# Patient Record
Sex: Male | Born: 1945 | Race: White | Hispanic: No | Marital: Married | Smoking: Former smoker
Health system: Southern US, Community
[De-identification: ages and names within clinical notes are randomized; demographics above are authoritative.]

## PROBLEM LIST (undated history)

## (undated) DIAGNOSIS — Z87891 Personal history of nicotine dependence: Principal | ICD-10-CM

## (undated) DIAGNOSIS — C189 Malignant neoplasm of colon, unspecified: Secondary | ICD-10-CM

## (undated) DIAGNOSIS — E785 Hyperlipidemia, unspecified: Secondary | ICD-10-CM

## (undated) HISTORY — DX: Malignant neoplasm of colon, unspecified: C18.9

## (undated) HISTORY — PX: COLON SURGERY: SHX602

## (undated) HISTORY — PX: HERNIA REPAIR: SHX51

## (undated) HISTORY — DX: Personal history of nicotine dependence: Z87.891

## (undated) HISTORY — DX: Hyperlipidemia, unspecified: E78.5

## (undated) HISTORY — PX: CATARACT EXTRACTION: SUR2

---
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
## 2005-09-11 ENCOUNTER — Ambulatory Visit: Payer: Self-pay | Admitting: Internal Medicine

## 2005-11-08 HISTORY — PX: COLECTOMY: SHX59

## 2007-08-09 HISTORY — PX: HERNIA REPAIR: SHX51

## 2010-11-08 HISTORY — PX: RETINAL DETACHMENT SURGERY: SHX105

## 2010-12-03 ENCOUNTER — Ambulatory Visit: Payer: Self-pay | Admitting: Ophthalmology

## 2012-06-05 DIAGNOSIS — H35379 Puckering of macula, unspecified eye: Secondary | ICD-10-CM | POA: Diagnosis not present

## 2012-07-17 DIAGNOSIS — E785 Hyperlipidemia, unspecified: Secondary | ICD-10-CM | POA: Diagnosis not present

## 2012-07-17 DIAGNOSIS — Z23 Encounter for immunization: Secondary | ICD-10-CM | POA: Diagnosis not present

## 2012-07-17 DIAGNOSIS — E559 Vitamin D deficiency, unspecified: Secondary | ICD-10-CM | POA: Diagnosis not present

## 2012-07-17 DIAGNOSIS — E039 Hypothyroidism, unspecified: Secondary | ICD-10-CM | POA: Diagnosis not present

## 2012-07-17 DIAGNOSIS — J449 Chronic obstructive pulmonary disease, unspecified: Secondary | ICD-10-CM | POA: Diagnosis not present

## 2012-11-13 DIAGNOSIS — H35379 Puckering of macula, unspecified eye: Secondary | ICD-10-CM | POA: Diagnosis not present

## 2013-02-06 DIAGNOSIS — E785 Hyperlipidemia, unspecified: Secondary | ICD-10-CM | POA: Diagnosis not present

## 2013-02-06 DIAGNOSIS — Z1331 Encounter for screening for depression: Secondary | ICD-10-CM | POA: Diagnosis not present

## 2013-02-06 DIAGNOSIS — Z1339 Encounter for screening examination for other mental health and behavioral disorders: Secondary | ICD-10-CM | POA: Diagnosis not present

## 2013-02-06 DIAGNOSIS — Z0000 Encounter for general adult medical examination without abnormal findings: Secondary | ICD-10-CM | POA: Diagnosis not present

## 2013-02-06 DIAGNOSIS — Z23 Encounter for immunization: Secondary | ICD-10-CM | POA: Diagnosis not present

## 2013-02-06 DIAGNOSIS — Z125 Encounter for screening for malignant neoplasm of prostate: Secondary | ICD-10-CM | POA: Diagnosis not present

## 2013-03-10 DIAGNOSIS — Z1339 Encounter for screening examination for other mental health and behavioral disorders: Secondary | ICD-10-CM | POA: Diagnosis not present

## 2013-03-10 DIAGNOSIS — T2104XA Burn of unspecified degree of lower back, initial encounter: Secondary | ICD-10-CM | POA: Diagnosis not present

## 2013-03-10 DIAGNOSIS — T2102XA Burn of unspecified degree of abdominal wall, initial encounter: Secondary | ICD-10-CM | POA: Diagnosis not present

## 2013-03-10 DIAGNOSIS — Z1331 Encounter for screening for depression: Secondary | ICD-10-CM | POA: Diagnosis not present

## 2013-06-25 DIAGNOSIS — H33009 Unspecified retinal detachment with retinal break, unspecified eye: Secondary | ICD-10-CM | POA: Diagnosis not present

## 2013-08-06 ENCOUNTER — Ambulatory Visit: Payer: Self-pay | Admitting: Family Medicine

## 2013-08-06 DIAGNOSIS — Z23 Encounter for immunization: Secondary | ICD-10-CM | POA: Diagnosis not present

## 2013-08-06 DIAGNOSIS — E785 Hyperlipidemia, unspecified: Secondary | ICD-10-CM | POA: Diagnosis not present

## 2013-08-06 DIAGNOSIS — R5381 Other malaise: Secondary | ICD-10-CM | POA: Diagnosis not present

## 2013-08-06 DIAGNOSIS — C189 Malignant neoplasm of colon, unspecified: Secondary | ICD-10-CM | POA: Diagnosis not present

## 2013-08-06 DIAGNOSIS — E039 Hypothyroidism, unspecified: Secondary | ICD-10-CM | POA: Diagnosis not present

## 2013-08-06 DIAGNOSIS — M79609 Pain in unspecified limb: Secondary | ICD-10-CM | POA: Diagnosis not present

## 2013-08-06 DIAGNOSIS — M19079 Primary osteoarthritis, unspecified ankle and foot: Secondary | ICD-10-CM | POA: Diagnosis not present

## 2014-01-14 DIAGNOSIS — H35379 Puckering of macula, unspecified eye: Secondary | ICD-10-CM | POA: Diagnosis not present

## 2014-02-18 DIAGNOSIS — Z980 Intestinal bypass and anastomosis status: Secondary | ICD-10-CM | POA: Diagnosis not present

## 2014-02-18 DIAGNOSIS — Z85038 Personal history of other malignant neoplasm of large intestine: Secondary | ICD-10-CM | POA: Diagnosis not present

## 2014-02-18 DIAGNOSIS — J449 Chronic obstructive pulmonary disease, unspecified: Secondary | ICD-10-CM | POA: Diagnosis not present

## 2014-02-18 DIAGNOSIS — E079 Disorder of thyroid, unspecified: Secondary | ICD-10-CM | POA: Diagnosis not present

## 2014-02-18 DIAGNOSIS — Z1211 Encounter for screening for malignant neoplasm of colon: Secondary | ICD-10-CM | POA: Diagnosis not present

## 2014-02-18 LAB — HM COLONOSCOPY

## 2014-03-05 DIAGNOSIS — R5381 Other malaise: Secondary | ICD-10-CM | POA: Diagnosis not present

## 2014-03-05 DIAGNOSIS — Z23 Encounter for immunization: Secondary | ICD-10-CM | POA: Diagnosis not present

## 2014-03-05 DIAGNOSIS — L03119 Cellulitis of unspecified part of limb: Secondary | ICD-10-CM | POA: Diagnosis not present

## 2014-03-05 DIAGNOSIS — E785 Hyperlipidemia, unspecified: Secondary | ICD-10-CM | POA: Diagnosis not present

## 2014-03-05 DIAGNOSIS — L02519 Cutaneous abscess of unspecified hand: Secondary | ICD-10-CM | POA: Diagnosis not present

## 2014-03-05 DIAGNOSIS — E039 Hypothyroidism, unspecified: Secondary | ICD-10-CM | POA: Diagnosis not present

## 2014-03-05 DIAGNOSIS — R5383 Other fatigue: Secondary | ICD-10-CM | POA: Diagnosis not present

## 2014-03-08 ENCOUNTER — Ambulatory Visit: Payer: Self-pay | Admitting: Family Medicine

## 2014-03-08 DIAGNOSIS — M79609 Pain in unspecified limb: Secondary | ICD-10-CM | POA: Diagnosis not present

## 2014-03-08 DIAGNOSIS — S6990XA Unspecified injury of unspecified wrist, hand and finger(s), initial encounter: Secondary | ICD-10-CM | POA: Diagnosis not present

## 2014-03-08 DIAGNOSIS — L02519 Cutaneous abscess of unspecified hand: Secondary | ICD-10-CM | POA: Diagnosis not present

## 2014-03-08 DIAGNOSIS — Z23 Encounter for immunization: Secondary | ICD-10-CM | POA: Diagnosis not present

## 2014-03-08 DIAGNOSIS — R5383 Other fatigue: Secondary | ICD-10-CM | POA: Diagnosis not present

## 2014-03-08 DIAGNOSIS — L03119 Cellulitis of unspecified part of limb: Secondary | ICD-10-CM | POA: Diagnosis not present

## 2014-03-08 DIAGNOSIS — R5381 Other malaise: Secondary | ICD-10-CM | POA: Diagnosis not present

## 2014-03-08 DIAGNOSIS — E785 Hyperlipidemia, unspecified: Secondary | ICD-10-CM | POA: Diagnosis not present

## 2014-03-08 DIAGNOSIS — E039 Hypothyroidism, unspecified: Secondary | ICD-10-CM | POA: Diagnosis not present

## 2014-03-15 DIAGNOSIS — E039 Hypothyroidism, unspecified: Secondary | ICD-10-CM | POA: Diagnosis not present

## 2014-03-15 DIAGNOSIS — Z23 Encounter for immunization: Secondary | ICD-10-CM | POA: Diagnosis not present

## 2014-03-15 DIAGNOSIS — L03119 Cellulitis of unspecified part of limb: Secondary | ICD-10-CM | POA: Diagnosis not present

## 2014-03-15 DIAGNOSIS — E785 Hyperlipidemia, unspecified: Secondary | ICD-10-CM | POA: Diagnosis not present

## 2014-03-15 DIAGNOSIS — R5381 Other malaise: Secondary | ICD-10-CM | POA: Diagnosis not present

## 2014-03-15 DIAGNOSIS — L02519 Cutaneous abscess of unspecified hand: Secondary | ICD-10-CM | POA: Diagnosis not present

## 2014-03-15 DIAGNOSIS — R5383 Other fatigue: Secondary | ICD-10-CM | POA: Diagnosis not present

## 2014-09-02 DIAGNOSIS — Z23 Encounter for immunization: Secondary | ICD-10-CM | POA: Diagnosis not present

## 2015-02-20 DIAGNOSIS — Z0000 Encounter for general adult medical examination without abnormal findings: Secondary | ICD-10-CM | POA: Diagnosis not present

## 2015-02-20 DIAGNOSIS — Z125 Encounter for screening for malignant neoplasm of prostate: Secondary | ICD-10-CM | POA: Diagnosis not present

## 2015-02-20 LAB — PSA: PSA: 1.5

## 2015-03-11 DIAGNOSIS — R5383 Other fatigue: Secondary | ICD-10-CM | POA: Diagnosis not present

## 2015-03-11 DIAGNOSIS — E559 Vitamin D deficiency, unspecified: Secondary | ICD-10-CM | POA: Diagnosis not present

## 2015-03-11 DIAGNOSIS — E780 Pure hypercholesterolemia: Secondary | ICD-10-CM | POA: Diagnosis not present

## 2015-03-11 DIAGNOSIS — E039 Hypothyroidism, unspecified: Secondary | ICD-10-CM | POA: Diagnosis not present

## 2015-03-12 DIAGNOSIS — E039 Hypothyroidism, unspecified: Secondary | ICD-10-CM | POA: Diagnosis not present

## 2015-03-12 DIAGNOSIS — E785 Hyperlipidemia, unspecified: Secondary | ICD-10-CM | POA: Diagnosis not present

## 2015-03-12 DIAGNOSIS — E559 Vitamin D deficiency, unspecified: Secondary | ICD-10-CM | POA: Diagnosis not present

## 2015-03-12 LAB — BASIC METABOLIC PANEL
BUN: 15 mg/dL (ref 4–21)
Creatinine: 1 mg/dL (ref 0.6–1.3)
Glucose: 128 mg/dL
Potassium: 4.5 mmol/L (ref 3.4–5.3)
Sodium: 140 mmol/L (ref 137–147)

## 2015-03-12 LAB — LIPID PANEL
Cholesterol: 190 mg/dL (ref 0–200)
HDL: 39 mg/dL (ref 35–70)
LDL Cholesterol: 116 mg/dL
LDL/HDL RATIO: 3
Triglycerides: 173 mg/dL — AB (ref 40–160)

## 2015-03-12 LAB — HEPATIC FUNCTION PANEL
ALK PHOS: 77 U/L (ref 25–125)
ALT: 24 U/L (ref 10–40)
AST: 19 U/L (ref 14–40)
Bilirubin, Total: 0.5 mg/dL

## 2015-03-12 LAB — TSH: TSH: 1.58 u[IU]/mL (ref 0.41–5.90)

## 2015-03-12 LAB — CBC AND DIFFERENTIAL
HCT: 44 % (ref 41–53)
Hemoglobin: 15.1 g/dL (ref 13.5–17.5)
Neutrophils Absolute: 3 /uL
PLATELETS: 273 10*3/uL (ref 150–399)
WBC: 5.4 10*3/mL

## 2015-04-19 ENCOUNTER — Other Ambulatory Visit: Payer: Self-pay | Admitting: Family Medicine

## 2015-04-24 ENCOUNTER — Telehealth: Payer: Self-pay | Admitting: Family Medicine

## 2015-04-24 NOTE — Telephone Encounter (Signed)
Patient is requesting a refill requesting for Levothyroxine Sodium 53mcg/David Trevino

## 2015-08-29 DIAGNOSIS — Z23 Encounter for immunization: Secondary | ICD-10-CM | POA: Diagnosis not present

## 2015-09-03 ENCOUNTER — Encounter: Payer: Self-pay | Admitting: Emergency Medicine

## 2015-09-03 DIAGNOSIS — H3320 Serous retinal detachment, unspecified eye: Secondary | ICD-10-CM | POA: Insufficient documentation

## 2015-09-03 DIAGNOSIS — H269 Unspecified cataract: Secondary | ICD-10-CM | POA: Insufficient documentation

## 2015-09-03 DIAGNOSIS — E739 Lactose intolerance, unspecified: Secondary | ICD-10-CM | POA: Insufficient documentation

## 2015-09-03 DIAGNOSIS — J449 Chronic obstructive pulmonary disease, unspecified: Secondary | ICD-10-CM | POA: Insufficient documentation

## 2015-09-03 DIAGNOSIS — E785 Hyperlipidemia, unspecified: Secondary | ICD-10-CM | POA: Insufficient documentation

## 2015-09-03 DIAGNOSIS — T310 Burns involving less than 10% of body surface: Secondary | ICD-10-CM | POA: Insufficient documentation

## 2015-09-03 DIAGNOSIS — E039 Hypothyroidism, unspecified: Secondary | ICD-10-CM | POA: Insufficient documentation

## 2015-09-03 DIAGNOSIS — C189 Malignant neoplasm of colon, unspecified: Secondary | ICD-10-CM | POA: Insufficient documentation

## 2015-09-03 DIAGNOSIS — E559 Vitamin D deficiency, unspecified: Secondary | ICD-10-CM | POA: Insufficient documentation

## 2015-09-11 ENCOUNTER — Ambulatory Visit (INDEPENDENT_AMBULATORY_CARE_PROVIDER_SITE_OTHER): Payer: Medicare Other | Admitting: Family Medicine

## 2015-09-11 ENCOUNTER — Encounter: Payer: Self-pay | Admitting: Family Medicine

## 2015-09-11 VITALS — BP 124/88 | HR 92 | Temp 98.6°F | Resp 14 | Wt 207.0 lb

## 2015-09-11 DIAGNOSIS — E038 Other specified hypothyroidism: Secondary | ICD-10-CM | POA: Diagnosis not present

## 2015-09-11 DIAGNOSIS — E785 Hyperlipidemia, unspecified: Secondary | ICD-10-CM | POA: Diagnosis not present

## 2015-09-11 DIAGNOSIS — M62838 Other muscle spasm: Secondary | ICD-10-CM | POA: Diagnosis not present

## 2015-09-11 DIAGNOSIS — Z23 Encounter for immunization: Secondary | ICD-10-CM | POA: Diagnosis not present

## 2015-09-11 DIAGNOSIS — R739 Hyperglycemia, unspecified: Secondary | ICD-10-CM | POA: Diagnosis not present

## 2015-09-11 DIAGNOSIS — Z87891 Personal history of nicotine dependence: Secondary | ICD-10-CM

## 2015-09-11 LAB — POCT GLYCOSYLATED HEMOGLOBIN (HGB A1C): Hemoglobin A1C: 6

## 2015-09-11 MED ORDER — CYCLOBENZAPRINE HCL 10 MG PO TABS: 10.0000 mg | ORAL_TABLET | Freq: Every day | ORAL | Status: DC

## 2015-09-11 MED ORDER — MELOXICAM 15 MG PO TABS: 15.0000 mg | ORAL_TABLET | Freq: Every day | ORAL | Status: DC
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
## 2015-09-12 LAB — CBC WITH DIFFERENTIAL/PLATELET
BASOS ABS: 0.1 10*3/uL (ref 0.0–0.2)
Basos: 1 %
EOS (ABSOLUTE): 0.1 10*3/uL (ref 0.0–0.4)
Eos: 2 %
HEMOGLOBIN: 15.2 g/dL (ref 12.6–17.7)
Hematocrit: 44.7 % (ref 37.5–51.0)
Immature Grans (Abs): 0 10*3/uL (ref 0.0–0.1)
Immature Granulocytes: 0 %
LYMPHS ABS: 1.4 10*3/uL (ref 0.7–3.1)
Lymphs: 25 %
MCH: 29.5 pg (ref 26.6–33.0)
MCHC: 34 g/dL (ref 31.5–35.7)
MCV: 87 fL (ref 79–97)
MONOCYTES: 10 %
MONOS ABS: 0.6 10*3/uL (ref 0.1–0.9)
NEUTROS ABS: 3.3 10*3/uL (ref 1.4–7.0)
Neutrophils: 62 %
Platelets: 244 10*3/uL (ref 150–379)
RBC: 5.16 x10E6/uL (ref 4.14–5.80)
RDW: 13.8 % (ref 12.3–15.4)
WBC: 5.4 10*3/uL (ref 3.4–10.8)

## 2015-09-12 LAB — LIPID PANEL WITH LDL/HDL RATIO
CHOLESTEROL TOTAL: 234 mg/dL — AB (ref 100–199)
HDL: 44 mg/dL (ref >39)
LDL Calculated: 158 mg/dL — ABNORMAL HIGH (ref 0–99)
LDl/HDL Ratio: 3.6 ratio units (ref 0.0–3.6)
TRIGLYCERIDES: 160 mg/dL — AB (ref 0–149)
VLDL Cholesterol Cal: 32 mg/dL (ref 5–40)

## 2015-09-12 LAB — COMPREHENSIVE METABOLIC PANEL
ALBUMIN: 4.5 g/dL (ref 3.6–4.8)
ALK PHOS: 72 IU/L (ref 39–117)
ALT: 24 IU/L (ref 0–44)
AST: 22 IU/L (ref 0–40)
Albumin/Globulin Ratio: 1.6 (ref 1.1–2.5)
BILIRUBIN TOTAL: 0.5 mg/dL (ref 0.0–1.2)
BUN / CREAT RATIO: 15 (ref 10–22)
BUN: 14 mg/dL (ref 8–27)
CHLORIDE: 101 mmol/L (ref 97–106)
CO2: 23 mmol/L (ref 18–29)
Calcium: 8.9 mg/dL (ref 8.6–10.2)
Creatinine, Ser: 0.95 mg/dL (ref 0.76–1.27)
GFR calc Af Amer: 95 mL/min/{1.73_m2} (ref >59)
GFR calc non Af Amer: 82 mL/min/{1.73_m2} (ref >59)
GLOBULIN, TOTAL: 2.8 g/dL (ref 1.5–4.5)
GLUCOSE: 116 mg/dL — AB (ref 65–99)
Potassium: 4.5 mmol/L (ref 3.5–5.2)
SODIUM: 140 mmol/L (ref 136–144)
Total Protein: 7.3 g/dL (ref 6.0–8.5)

## 2015-09-12 LAB — TSH: TSH: 1.11 u[IU]/mL (ref 0.450–4.500)

## 2015-09-17 ENCOUNTER — Other Ambulatory Visit: Payer: Self-pay | Admitting: Family Medicine

## 2015-09-17 ENCOUNTER — Encounter: Payer: Self-pay | Admitting: Family Medicine

## 2015-09-17 DIAGNOSIS — Z87891 Personal history of nicotine dependence: Secondary | ICD-10-CM

## 2015-09-17 HISTORY — DX: Personal history of nicotine dependence: Z87.891

## 2015-09-19 ENCOUNTER — Inpatient Hospital Stay: Payer: Medicare Other | Attending: Family Medicine | Admitting: Family Medicine

## 2015-09-19 ENCOUNTER — Ambulatory Visit
Admission: RE | Admit: 2015-09-19 | Discharge: 2015-09-19 | Disposition: A | Discharge status: Home / Self Care | Payer: Medicare Other | Source: Ambulatory Visit | Attending: Family Medicine | Admitting: Family Medicine

## 2015-09-19 ENCOUNTER — Encounter: Payer: Self-pay | Admitting: Family Medicine

## 2015-09-19 DIAGNOSIS — Z122 Encounter for screening for malignant neoplasm of respiratory organs: Secondary | ICD-10-CM

## 2015-09-19 DIAGNOSIS — Z87891 Personal history of nicotine dependence: Secondary | ICD-10-CM

## 2015-09-19 DIAGNOSIS — D3502 Benign neoplasm of left adrenal gland: Secondary | ICD-10-CM | POA: Insufficient documentation

## 2015-09-19 NOTE — Progress Notes (Signed)
In accordance with CMS guidelines, patient has meet eligibility criteria including age, absence of signs or symptoms of lung cancer, the specific calculation of cigarette smoking pack-years was 45 years and is a former smoker, having quit 9 years ago in 2007.   A shared decision-making session was conducted prior to the performance of CT scan. This includes one or more decision aids, includes benefits and harms of screening, follow-up diagnostic testing, over-diagnosis, false positive rate, and total radiation exposure.  Counseling on the importance of adherence to annual lung cancer LDCT screening, impact of co-morbidities, and ability or willingness to undergo diagnosis and treatment is imperative for compliance of the program.  Counseling on the importance of continued smoking cessation for former smokers; the importance of smoking cessation for current smokers and information about tobacco cessation interventions have been given to patient including the [place] [facility], 1800 quit , as well as [place] specific smoking cessation programs.  Written order for lung cancer screening with LDCT has been given to the patient and any and all questions have been answered to the best of my abilities.   Yearly follow up will be scheduled by Burgess Estelle, Thoracic Navigator.

## 2015-09-22 ENCOUNTER — Telehealth: Payer: Self-pay | Admitting: *Deleted

## 2015-09-22 NOTE — Telephone Encounter (Signed)
Notified patient of LDCT lung cancer screening results of Lung Rads 2 finding with recommendation for 12 month follow up imaging. Also notified of incidental finding noted below. Patient verbalizes understanding.  IMPRESSION: 1. Lung-RADS Category 2, benign appearance or behavior. Continue annual screening with low-dose chest CT without contrast in 12 months. 2. Left adrenal adenoma.

## 2016-03-10 ENCOUNTER — Ambulatory Visit (INDEPENDENT_AMBULATORY_CARE_PROVIDER_SITE_OTHER): Payer: Medicare Other | Admitting: Family Medicine

## 2016-03-10 VITALS — BP 112/84 | HR 72 | Temp 98.4°F | Resp 16 | Wt 202.0 lb

## 2016-03-10 DIAGNOSIS — R5383 Other fatigue: Secondary | ICD-10-CM | POA: Diagnosis not present

## 2016-03-10 DIAGNOSIS — L821 Other seborrheic keratosis: Secondary | ICD-10-CM | POA: Diagnosis not present

## 2016-03-10 DIAGNOSIS — L918 Other hypertrophic disorders of the skin: Secondary | ICD-10-CM

## 2016-03-10 DIAGNOSIS — E038 Other specified hypothyroidism: Secondary | ICD-10-CM

## 2016-03-10 DIAGNOSIS — K4090 Unilateral inguinal hernia, without obstruction or gangrene, not specified as recurrent: Secondary | ICD-10-CM | POA: Diagnosis not present

## 2016-03-10 DIAGNOSIS — E785 Hyperlipidemia, unspecified: Secondary | ICD-10-CM | POA: Diagnosis not present

## 2016-03-10 DIAGNOSIS — R739 Hyperglycemia, unspecified: Secondary | ICD-10-CM

## 2016-03-10 LAB — POCT GLYCOSYLATED HEMOGLOBIN (HGB A1C): Hemoglobin A1C: 6.2
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
## 2016-03-11 LAB — CBC WITH DIFFERENTIAL/PLATELET
BASOS ABS: 0 10*3/uL (ref 0.0–0.2)
Basos: 1 %
EOS (ABSOLUTE): 0.1 10*3/uL (ref 0.0–0.4)
EOS: 2 %
HEMATOCRIT: 43.9 % (ref 37.5–51.0)
HEMOGLOBIN: 15.3 g/dL (ref 12.6–17.7)
IMMATURE GRANS (ABS): 0 10*3/uL (ref 0.0–0.1)
IMMATURE GRANULOCYTES: 0 %
LYMPHS ABS: 1.9 10*3/uL (ref 0.7–3.1)
LYMPHS: 36 %
MCH: 30.3 pg (ref 26.6–33.0)
MCHC: 34.9 g/dL (ref 31.5–35.7)
MCV: 87 fL (ref 79–97)
MONOCYTES: 12 %
Monocytes Absolute: 0.7 10*3/uL (ref 0.1–0.9)
Neutrophils Absolute: 2.7 10*3/uL (ref 1.4–7.0)
Neutrophils: 49 %
Platelets: 260 10*3/uL (ref 150–379)
RBC: 5.05 x10E6/uL (ref 4.14–5.80)
RDW: 13.9 % (ref 12.3–15.4)
WBC: 5.3 10*3/uL (ref 3.4–10.8)

## 2016-03-11 LAB — COMPREHENSIVE METABOLIC PANEL
A/G RATIO: 1.6 (ref 1.2–2.2)
ALBUMIN: 4.4 g/dL (ref 3.6–4.8)
ALK PHOS: 67 IU/L (ref 39–117)
ALT: 23 IU/L (ref 0–44)
AST: 19 IU/L (ref 0–40)
BUN / CREAT RATIO: 11 (ref 10–24)
BUN: 12 mg/dL (ref 8–27)
Bilirubin Total: 0.5 mg/dL (ref 0.0–1.2)
CO2: 24 mmol/L (ref 18–29)
CREATININE: 1.08 mg/dL (ref 0.76–1.27)
Calcium: 9.1 mg/dL (ref 8.6–10.2)
Chloride: 102 mmol/L (ref 96–106)
GFR calc Af Amer: 81 mL/min/{1.73_m2} (ref >59)
GFR calc non Af Amer: 70 mL/min/{1.73_m2} (ref >59)
GLOBULIN, TOTAL: 2.8 g/dL (ref 1.5–4.5)
Glucose: 124 mg/dL — ABNORMAL HIGH (ref 65–99)
POTASSIUM: 5.1 mmol/L (ref 3.5–5.2)
SODIUM: 140 mmol/L (ref 134–144)
Total Protein: 7.2 g/dL (ref 6.0–8.5)

## 2016-03-11 LAB — LIPID PANEL WITH LDL/HDL RATIO
CHOLESTEROL TOTAL: 210 mg/dL — AB (ref 100–199)
HDL: 44 mg/dL (ref >39)
LDL Calculated: 137 mg/dL — ABNORMAL HIGH (ref 0–99)
LDl/HDL Ratio: 3.1 ratio units (ref 0.0–3.6)
Triglycerides: 146 mg/dL (ref 0–149)
VLDL CHOLESTEROL CAL: 29 mg/dL (ref 5–40)

## 2016-03-11 LAB — TSH: TSH: 1.39 u[IU]/mL (ref 0.450–4.500)

## 2016-03-23 ENCOUNTER — Encounter: Payer: Self-pay | Admitting: Family Medicine

## 2016-03-23 ENCOUNTER — Ambulatory Visit (INDEPENDENT_AMBULATORY_CARE_PROVIDER_SITE_OTHER): Payer: Medicare Other | Admitting: Family Medicine

## 2016-03-23 VITALS — BP 120/82 | HR 84 | Temp 98.0°F | Resp 16 | Wt 201.8 lb

## 2016-03-23 DIAGNOSIS — J029 Acute pharyngitis, unspecified: Secondary | ICD-10-CM

## 2016-03-23 DIAGNOSIS — J0100 Acute maxillary sinusitis, unspecified: Secondary | ICD-10-CM | POA: Diagnosis not present

## 2016-03-23 LAB — POCT RAPID STREP A (OFFICE): Rapid Strep A Screen: NEGATIVE

## 2016-03-23 MED ORDER — PSEUDOEPH-BROMPHEN-DM 30-2-10 MG/5ML PO SYRP: 5.0000 mL | ORAL_SOLUTION | Freq: Four times a day (QID) | ORAL | Status: DC | PRN

## 2016-03-23 MED ORDER — AMOXICILLIN 500 MG PO CAPS: 500.0000 mg | ORAL_CAPSULE | Freq: Three times a day (TID) | ORAL | Status: DC
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
## 2016-04-20 ENCOUNTER — Ambulatory Visit (INDEPENDENT_AMBULATORY_CARE_PROVIDER_SITE_OTHER): Payer: Medicare Other | Admitting: Family Medicine

## 2016-04-20 ENCOUNTER — Encounter: Payer: Self-pay | Admitting: Family Medicine

## 2016-04-20 VITALS — BP 120/78 | HR 78 | Temp 98.2°F | Resp 16 | Wt 202.4 lb

## 2016-04-20 DIAGNOSIS — R05 Cough: Secondary | ICD-10-CM

## 2016-04-20 DIAGNOSIS — R0982 Postnasal drip: Secondary | ICD-10-CM

## 2016-04-20 DIAGNOSIS — R059 Cough, unspecified: Secondary | ICD-10-CM

## 2016-04-20 MED ORDER — IPRATROPIUM BROMIDE 0.03 % NA SOLN: 2.0000 | Freq: Two times a day (BID) | NASAL | Status: DC

## 2016-04-20 MED ORDER — BENZONATATE 200 MG PO CAPS: 200.0000 mg | ORAL_CAPSULE | Freq: Two times a day (BID) | ORAL | Status: DC | PRN

## 2016-04-20 MED ORDER — LORATADINE 10 MG PO TABS: 10.0000 mg | ORAL_TABLET | Freq: Every day | ORAL | Status: DC
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
## 2016-04-20 NOTE — Patient Instructions (Signed)

## 2016-04-24 ENCOUNTER — Other Ambulatory Visit: Payer: Self-pay | Admitting: Family Medicine

## 2016-05-19 ENCOUNTER — Telehealth: Payer: Self-pay | Admitting: Emergency Medicine

## 2016-05-19 MED ORDER — LEVOTHYROXINE SODIUM 50 MCG PO TABS: 50.0000 ug | ORAL_TABLET | Freq: Every day | ORAL | Status: DC

## 2016-05-19 NOTE — Telephone Encounter (Signed)
Pt medication called in.

## 2016-08-17 DIAGNOSIS — Z23 Encounter for immunization: Secondary | ICD-10-CM | POA: Diagnosis not present

## 2016-09-21 ENCOUNTER — Ambulatory Visit (INDEPENDENT_AMBULATORY_CARE_PROVIDER_SITE_OTHER): Payer: Medicare Other | Admitting: Family Medicine

## 2016-09-21 ENCOUNTER — Encounter: Payer: Self-pay | Admitting: Family Medicine

## 2016-09-21 VITALS — BP 118/82 | HR 86 | Temp 98.3°F | Resp 14 | Ht 73.0 in | Wt 204.0 lb

## 2016-09-21 DIAGNOSIS — M25571 Pain in right ankle and joints of right foot: Secondary | ICD-10-CM | POA: Diagnosis not present

## 2016-09-21 DIAGNOSIS — R739 Hyperglycemia, unspecified: Secondary | ICD-10-CM | POA: Diagnosis not present

## 2016-09-21 DIAGNOSIS — Z1211 Encounter for screening for malignant neoplasm of colon: Secondary | ICD-10-CM

## 2016-09-21 DIAGNOSIS — Z0000 Encounter for general adult medical examination without abnormal findings: Secondary | ICD-10-CM | POA: Diagnosis not present

## 2016-09-21 LAB — POCT GLYCOSYLATED HEMOGLOBIN (HGB A1C): HEMOGLOBIN A1C: 5.6
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
## 2017-03-10 LAB — IFOBT (OCCULT BLOOD): IMMUNOLOGICAL FECAL OCCULT BLOOD TEST: NEGATIVE

## 2017-03-22 ENCOUNTER — Ambulatory Visit (INDEPENDENT_AMBULATORY_CARE_PROVIDER_SITE_OTHER): Payer: Medicare Other | Admitting: Family Medicine

## 2017-03-22 ENCOUNTER — Encounter: Payer: Self-pay | Admitting: Family Medicine

## 2017-03-22 VITALS — BP 104/72 | HR 66 | Temp 98.4°F | Resp 14 | Wt 203.0 lb

## 2017-03-22 DIAGNOSIS — E7849 Other hyperlipidemia: Secondary | ICD-10-CM

## 2017-03-22 DIAGNOSIS — R5383 Other fatigue: Secondary | ICD-10-CM | POA: Diagnosis not present

## 2017-03-22 DIAGNOSIS — E784 Other hyperlipidemia: Secondary | ICD-10-CM

## 2017-03-22 DIAGNOSIS — Z1159 Encounter for screening for other viral diseases: Secondary | ICD-10-CM

## 2017-03-22 DIAGNOSIS — R739 Hyperglycemia, unspecified: Secondary | ICD-10-CM | POA: Diagnosis not present

## 2017-03-22 DIAGNOSIS — E038 Other specified hypothyroidism: Secondary | ICD-10-CM | POA: Diagnosis not present
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
## 2017-03-23 LAB — COMPREHENSIVE METABOLIC PANEL
A/G RATIO: 1.7 (ref 1.2–2.2)
ALBUMIN: 4.5 g/dL (ref 3.6–4.8)
ALT: 20 IU/L (ref 0–44)
AST: 21 IU/L (ref 0–40)
Alkaline Phosphatase: 71 IU/L (ref 39–117)
BUN / CREAT RATIO: 11 (ref 10–24)
BUN: 12 mg/dL (ref 8–27)
Bilirubin Total: 0.5 mg/dL (ref 0.0–1.2)
CALCIUM: 9.2 mg/dL (ref 8.6–10.2)
CO2: 20 mmol/L (ref 18–29)
CREATININE: 1.08 mg/dL (ref 0.76–1.27)
Chloride: 102 mmol/L (ref 96–106)
GFR, EST AFRICAN AMERICAN: 81 mL/min/{1.73_m2} (ref >59)
GFR, EST NON AFRICAN AMERICAN: 70 mL/min/{1.73_m2} (ref >59)
GLOBULIN, TOTAL: 2.6 g/dL (ref 1.5–4.5)
Glucose: 122 mg/dL — ABNORMAL HIGH (ref 65–99)
POTASSIUM: 4.3 mmol/L (ref 3.5–5.2)
SODIUM: 139 mmol/L (ref 134–144)
Total Protein: 7.1 g/dL (ref 6.0–8.5)

## 2017-03-23 LAB — CBC WITH DIFFERENTIAL/PLATELET
BASOS: 1 %
Basophils Absolute: 0 10*3/uL (ref 0.0–0.2)
EOS (ABSOLUTE): 0.1 10*3/uL (ref 0.0–0.4)
EOS: 2 %
HEMATOCRIT: 46.3 % (ref 37.5–51.0)
HEMOGLOBIN: 15.3 g/dL (ref 13.0–17.7)
IMMATURE GRANULOCYTES: 0 %
Immature Grans (Abs): 0 10*3/uL (ref 0.0–0.1)
LYMPHS ABS: 1.7 10*3/uL (ref 0.7–3.1)
Lymphs: 39 %
MCH: 29.9 pg (ref 26.6–33.0)
MCHC: 33 g/dL (ref 31.5–35.7)
MCV: 91 fL (ref 79–97)
MONOCYTES: 10 %
MONOS ABS: 0.4 10*3/uL (ref 0.1–0.9)
Neutrophils Absolute: 2.2 10*3/uL (ref 1.4–7.0)
Neutrophils: 48 %
Platelets: 230 10*3/uL (ref 150–379)
RBC: 5.11 x10E6/uL (ref 4.14–5.80)
RDW: 14.9 % (ref 12.3–15.4)
WBC: 4.4 10*3/uL (ref 3.4–10.8)

## 2017-03-23 LAB — LIPID PANEL WITH LDL/HDL RATIO
Cholesterol, Total: 191 mg/dL (ref 100–199)
HDL: 43 mg/dL (ref >39)
LDL CALC: 115 mg/dL — AB (ref 0–99)
LDL/HDL RATIO: 2.7 ratio (ref 0.0–3.6)
Triglycerides: 165 mg/dL — ABNORMAL HIGH (ref 0–149)
VLDL Cholesterol Cal: 33 mg/dL (ref 5–40)

## 2017-03-23 LAB — HEPATITIS C ANTIBODY

## 2017-03-23 LAB — HEMOGLOBIN A1C
ESTIMATED AVERAGE GLUCOSE: 131 mg/dL
HEMOGLOBIN A1C: 6.2 % — AB (ref 4.8–5.6)

## 2017-03-23 LAB — TSH: TSH: 1.65 u[IU]/mL (ref 0.450–4.500)

## 2017-03-24 ENCOUNTER — Telehealth: Payer: Self-pay

## 2017-03-24 NOTE — Telephone Encounter (Signed)
-----   Message from [name], MD sent at 03/24/2017  2:24 PM EDT ----- Labs stable

## 2017-03-24 NOTE — Telephone Encounter (Signed)
Advised pt of lab results. Pt verbally acknowledges understanding. Emily Drozdowski, CMA   

## 2017-04-11 ENCOUNTER — Other Ambulatory Visit: Payer: Self-pay | Admitting: Family Medicine

## 2017-07-16 ENCOUNTER — Emergency Department
Admission: EM | Admit: 2017-07-16 | Discharge: 2017-07-16 | Disposition: A | Discharge status: Home / Self Care | Payer: Medicare Other | Attending: Emergency Medicine | Admitting: Emergency Medicine

## 2017-07-16 ENCOUNTER — Encounter: Payer: Self-pay | Admitting: Emergency Medicine

## 2017-07-16 ENCOUNTER — Emergency Department: Payer: Medicare Other

## 2017-07-16 DIAGNOSIS — Z79899 Other long term (current) drug therapy: Secondary | ICD-10-CM | POA: Insufficient documentation

## 2017-07-16 DIAGNOSIS — Y999 Unspecified external cause status: Secondary | ICD-10-CM | POA: Insufficient documentation

## 2017-07-16 DIAGNOSIS — Y939 Activity, unspecified: Secondary | ICD-10-CM | POA: Insufficient documentation

## 2017-07-16 DIAGNOSIS — W010XXA Fall on same level from slipping, tripping and stumbling without subsequent striking against object, initial encounter: Secondary | ICD-10-CM | POA: Insufficient documentation

## 2017-07-16 DIAGNOSIS — E039 Hypothyroidism, unspecified: Secondary | ICD-10-CM | POA: Insufficient documentation

## 2017-07-16 DIAGNOSIS — Y9289 Other specified places as the place of occurrence of the external cause: Secondary | ICD-10-CM | POA: Diagnosis not present

## 2017-07-16 DIAGNOSIS — W231XXA Caught, crushed, jammed, or pinched between stationary objects, initial encounter: Secondary | ICD-10-CM | POA: Insufficient documentation

## 2017-07-16 DIAGNOSIS — S59901A Unspecified injury of right elbow, initial encounter: Secondary | ICD-10-CM | POA: Insufficient documentation

## 2017-07-16 DIAGNOSIS — M25421 Effusion, right elbow: Secondary | ICD-10-CM | POA: Diagnosis not present

## 2017-07-16 DIAGNOSIS — Z85038 Personal history of other malignant neoplasm of large intestine: Secondary | ICD-10-CM | POA: Insufficient documentation

## 2017-07-16 MED ORDER — HYDROCODONE-ACETAMINOPHEN 5-325 MG PO TABS: 1.0000 | ORAL_TABLET | Freq: Four times a day (QID) | ORAL | 0 refills | Status: DC | PRN

## 2017-07-16 NOTE — ED Triage Notes (Signed)
Patient presents to the ED with pain to right elbow with movement of arm.  Patient states he had a mechanical fall at approx. 3pm.  Patient reports stepping in briars, and then quickly jumping back and losing his balance.  Patient reports he landed on his elbow and states, "It looked like it was out of joint, but I kind of pulled it and it seemed like it went back, but now it's really swollen."  Patient is in no obvious distress at this time.
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
## 2017-07-16 NOTE — ED Notes (Signed)
Patient transported to X-ray 

## 2017-07-18 DIAGNOSIS — S5001XA Contusion of right elbow, initial encounter: Secondary | ICD-10-CM | POA: Diagnosis not present

## 2017-07-19 DIAGNOSIS — S5000XA Contusion of unspecified elbow, initial encounter: Secondary | ICD-10-CM | POA: Insufficient documentation

## 2017-07-28 DIAGNOSIS — S5001XA Contusion of right elbow, initial encounter: Secondary | ICD-10-CM | POA: Diagnosis not present

## 2017-08-10 ENCOUNTER — Telehealth: Payer: Self-pay | Admitting: Family Medicine

## 2017-09-22 ENCOUNTER — Telehealth: Payer: Self-pay

## 2017-09-22 ENCOUNTER — Ambulatory Visit: Payer: Medicare Other | Admitting: Family Medicine

## 2017-09-22 NOTE — Telephone Encounter (Signed)
Tried to schedule AWV for pt before next apt on 10/03/17. No available times with [facility] before the 4:00 PCP apt. Pt declined doing this on an earlier date. Pt states it is the end of the year and he cannot take a lot of time off work. Advised pt to CB at a better time when he has availability to schedule this apt. Pt stated understanding.

## 2017-10-03 ENCOUNTER — Ambulatory Visit (INDEPENDENT_AMBULATORY_CARE_PROVIDER_SITE_OTHER): Payer: Medicare Other | Admitting: Family Medicine

## 2017-10-03 ENCOUNTER — Encounter: Payer: Self-pay | Admitting: Family Medicine

## 2017-10-03 VITALS — BP 132/80 | HR 84 | Temp 98.4°F | Resp 18 | Wt 209.0 lb

## 2017-10-03 DIAGNOSIS — E039 Hypothyroidism, unspecified: Secondary | ICD-10-CM

## 2017-10-03 DIAGNOSIS — E785 Hyperlipidemia, unspecified: Secondary | ICD-10-CM

## 2017-10-03 DIAGNOSIS — C189 Malignant neoplasm of colon, unspecified: Secondary | ICD-10-CM | POA: Diagnosis not present

## 2017-10-03 DIAGNOSIS — R739 Hyperglycemia, unspecified: Secondary | ICD-10-CM | POA: Diagnosis not present

## 2017-10-03 LAB — POCT GLYCOSYLATED HEMOGLOBIN (HGB A1C): Hemoglobin A1C: 6

## 2017-10-03 NOTE — Progress Notes (Signed)
Patient: David Trevino Male    DOB: 12/31/1945   71 y.o.   MRN: [id number] Visit Date: 10/03/2017  Today's Provider: Wilhemena Durie, MD   Chief Complaint  Patient presents with  . Hyperglycemia  . Hypothyroidism   Subjective:    HPI Pt is here today for a routine 6 month follow up. He reports that he has been feeling well until this past week he has had a head cold, but feels like he is getting better.   Hyperglycemia Last a1c was 6.2 on 03/22/17.      No Known Allergies   Current Outpatient Medications:  .  Cholecalciferol (VITAMIN D) 2000 UNITS tablet, Take by mouth., Disp: , Rfl:  .  ipratropium (ATROVENT) 0.03 % nasal spray, Place 2 sprays into both nostrils every 12 (twelve) hours., Disp: 30 mL, Rfl: 12 .  levothyroxine (SYNTHROID, LEVOTHROID) 50 MCG tablet, TAKE 1 TABLET BY MOUTH ONCE A DAY, Disp: 90 tablet, Rfl: 3 .  loratadine (CLARITIN) 10 MG tablet, Take 1 tablet (10 mg total) by mouth daily., Disp: 30 tablet, Rfl: 11 .  Niacin CR 250 MG TBCR, Take by mouth., Disp: , Rfl:  .  omega-3 fish oil (MAXEPA) 1000 MG CAPS capsule, Take by mouth., Disp: , Rfl:  .  HYDROcodone-acetaminophen (NORCO/VICODIN) 5-325 MG tablet, Take 1 tablet by mouth every 6 (six) hours as needed for moderate pain. (Patient not taking: Reported on 10/03/2017), Disp: 6 tablet, Rfl: 0  Review of Systems  Constitutional: Positive for fatigue.  HENT: Positive for rhinorrhea, sinus pressure and sneezing.   Eyes: Negative.   Respiratory: Negative.   Cardiovascular: Negative.   Gastrointestinal: Negative.   Endocrine: Negative.   Genitourinary: Negative.   Musculoskeletal: Negative.   Skin: Negative.   Allergic/Immunologic: Negative.   Neurological: Negative.   Hematological: Negative.   Psychiatric/Behavioral: Negative.     Social History   Tobacco Use  . Smoking status: Former Smoker    Packs/day: 1.00    Years: 40.00    Pack years: 40.00    Types: Cigarettes   Last attempt to quit: 11/21/2005    Years since quitting: 11.8  . Smokeless tobacco: Never Used  Substance Use Topics  . Alcohol use: No    Comment: Quit drinking alcohol 10/16/2005   Objective:   BP 132/80 (BP Location: Right Arm, Patient Position: Sitting, Cuff Size: Normal)   Pulse 84   Temp 98.4 F (36.9 C) (Oral)   Resp 18   Wt 209 lb (94.8 kg)   BMI 27.57 kg/m  Vitals:   10/03/17 1554  BP: 132/80  Pulse: 84  Resp: 18  Temp: 98.4 F (36.9 C)  TempSrc: Oral  Weight: 209 lb (94.8 kg)     Physical Exam  Constitutional: He is oriented to person, place, and time. He appears well-developed and well-nourished.  HENT:  Head: Normocephalic and atraumatic.  Eyes: Conjunctivae are normal. No scleral icterus.  Neck: No thyromegaly present.  Cardiovascular: Normal rate, regular rhythm and normal heart sounds.  Pulmonary/Chest: Effort normal and breath sounds normal.  Abdominal: Soft.  Neurological: He is alert and oriented to person, place, and time.  Skin: Skin is warm and dry.  Psychiatric: He has a normal mood and affect. His behavior is normal. Judgment and thought content normal.        Assessment & Plan:     1. Adult hypothyroidism   2. Hyperlipidemia, unspecified hyperlipidemia type   3. Hyperglycemia  -  POCT HgB A1C      I have done the exam and reviewed the above chart and it is accurate to the best of my knowledge. Development worker, community has been used in this note in any air is in the dictation or transcription are unintentional.  Wilhemena Durie, MD  [place]

## 2017-12-15 NOTE — Telephone Encounter (Signed)
Visit scheduled °

## 2018-03-28 ENCOUNTER — Ambulatory Visit (INDEPENDENT_AMBULATORY_CARE_PROVIDER_SITE_OTHER): Payer: Medicare Other

## 2018-03-28 VITALS — BP 150/78 | HR 62 | Temp 97.8°F | Ht 73.0 in | Wt 207.4 lb

## 2018-03-28 DIAGNOSIS — Z0000 Encounter for general adult medical examination without abnormal findings: Secondary | ICD-10-CM | POA: Diagnosis not present

## 2018-03-28 NOTE — Progress Notes (Signed)
Subjective:   David Trevino is a 72 y.o. male who presents for Medicare Annual/Subsequent preventive examination.  Review of Systems:  N/A  Cardiac Risk Factors include: advanced age (>40men, >71 women);dyslipidemia;male gender     Objective:    Vitals: BP (!) 150/78 (BP Location: Right Arm)   Pulse 62   Temp 97.8 F (36.6 C) (Oral)   Ht 6\' 1"  (1.854 m)   Wt 207 lb 6.4 oz (94.1 kg)   BMI 27.36 kg/m   Body mass index is 27.36 kg/m.  Advanced Directives 03/28/2018 07/16/2017 09/21/2016  Does Patient Have a Medical Advance Directive? No No No  Would patient like information on creating a medical advance directive? Yes (MAU/Ambulatory/Procedural Areas - Information given) No - Patient declined -    Tobacco Social History   Tobacco Use  Smoking Status Former Smoker  . Packs/day: 1.00  . Years: 40.00  . Pack years: 40.00  . Types: Cigarettes  . Last attempt to quit: 11/21/2005  . Years since quitting: 12.3  Smokeless Tobacco Never Used     Counseling given: Not Answered   Clinical Intake:  Pre-visit preparation completed: Yes  Pain : No/denies pain Pain Score: 0-No pain     Nutritional Status: BMI 25 -29 Overweight Nutritional Risks: None Diabetes: No  How often do you need to have someone help you when you read instructions, pamphlets, or other written materials from your doctor or pharmacy?: 1 - Never  Interpreter Needed?: No  Information entered by :: [facility], LPN  Past Medical History:  Diagnosis Date  . Hyperlipidemia   . Personal history of tobacco use, presenting hazards to health 09/17/2015   Past Surgical History:  Procedure Laterality Date  . CATARACT EXTRACTION Bilateral    was done in 2 different months but unsure of which months.   . 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 HERNIA REPAIR  08/2007  . RETINAL DETACHMENT SURGERY Right 11/2010  .  TONSILLECTOMY AND ADENOIDECTOMY  1960   Family History  Problem Relation Age of Onset  . Arthritis Mother   . Diabetes Mother   . Cancer Father        bladder  . Kidney disease Father   . Allergies Sister   . Cancer Brother        prostate   Social History   Socioeconomic History  . Marital status: Married    Spouse name: Not on file  . Number of children: 2  . Years of education: Not on file  . Highest education level: Some college, no degree  Occupational History  . Occupation: retired  Scientific laboratory technician  . Financial resource strain: Not hard at all  . Food insecurity:    Worry: Never true    Inability: Never true  . Transportation needs:    Medical: No    Non-medical: No  Tobacco Use  . Smoking status: Former Smoker    Packs/day: 1.00    Years: 40.00    Pack years: 40.00    Types: Cigarettes    Last attempt to quit: 11/21/2005    Years since quitting: 12.3  . Smokeless tobacco: Never Used  Substance and Sexual Activity  . Alcohol use: No    Comment: Quit drinking alcohol 10/16/2005  . Drug use: No  . Sexual activity: Not on file  Lifestyle  . Physical activity:    Days per week: Not  on file    Minutes per session: Not on file  . Stress: Not at all  Relationships  . Social connections:    Talks on phone: Not on file    Gets together: Not on file    Attends religious service: Not on file    Active member of club or organization: Not on file    Attends meetings of clubs or organizations: Not on file    Relationship status: Not on file  Other Topics Concern  . Not on file  Social History Narrative  . Not on file    Outpatient Encounter Medications as of 03/28/2018  Medication Sig  . aspirin-acetaminophen-caffeine (EXCEDRIN MIGRAINE) 250-250-65 MG tablet Take 1 tablet by mouth every 6 (six) hours as needed for headache.  . Cholecalciferol (VITAMIN D) 2000 UNITS tablet Take 2,000 Units by mouth daily.   Marland Kitchen ipratropium (ATROVENT) 0.03 % nasal spray Place 2  sprays into both nostrils every 12 (twelve) hours. (Patient taking differently: Place 2 sprays into both nostrils 2 (two) times daily. )  . levothyroxine (SYNTHROID, LEVOTHROID) 50 MCG tablet TAKE 1 TABLET BY MOUTH ONCE A DAY  . loratadine (CLARITIN) 10 MG tablet Take 1 tablet (10 mg total) by mouth daily.  . Niacin CR 250 MG TBCR Take 250 mg by mouth daily.   Marland Kitchen omega-3 fish oil (MAXEPA) 1000 MG CAPS capsule Take 1 capsule by mouth daily.   Marland Kitchen HYDROcodone-acetaminophen (NORCO/VICODIN) 5-325 MG tablet Take 1 tablet by mouth every 6 (six) hours as needed for moderate pain. (Patient not taking: Reported on 10/03/2017)  . niacin 250 MG tablet Take by mouth.  . Omega-3 1000 MG CAPS Take by mouth.   No facility-administered encounter medications on file as of 03/28/2018.     Activities of Daily Living In your present state of health, do you have any difficulty performing the following activities: 03/28/2018 10/03/2017  Hearing? N N  Vision? N N  Difficulty concentrating or making decisions? N N  Walking or climbing stairs? N N  Dressing or bathing? N N  Doing errands, shopping? N N  Preparing Food and eating ? N -  Using the Toilet? N -  In the past six months, have you accidently leaked urine? N -  Do you have problems with loss of bowel control? N -  Managing your Medications? N -  Managing your Finances? N -  Housekeeping or managing your Housekeeping? N -  Some recent data might be hidden    Patient Care Team: [name], MD as PCP - General (Family Medicine) Manya Silvas, MD as Consulting Physician (Gastroenterology) Aurelio Jew, MD as Referring Physician (Surgical Oncology)   Assessment:   This is a routine wellness examination for [facility].  Exercise Activities and Dietary recommendations Current Exercise Habits: The patient does not participate in regular exercise at present(Works around home and yardwork.), Exercise limited by: None identified  Goals     . DIET - REDUCE SUGAR INTAKE     Recommend cutting back on sugar and sweets to a couple times a week.        Fall Risk Fall Risk  03/28/2018 10/03/2017 09/21/2016 09/11/2015  Falls in the past year? Yes Yes Yes No  Number falls in past yr: 1 1 1  -  Comment stumble to a fall - - -  Injury with Fall? No Yes No -  Follow up Falls prevention discussed - - -   Is the patient's home free of loose  throw rugs in walkways, pet beds, electrical cords, etc?   yes      Grab bars in the bathroom? no      Handrails on the stairs?   no      Adequate lighting?   yes  Timed Get Up and Go Performed: N/A  Depression Screen PHQ 2/9 Scores 03/28/2018 10/03/2017 09/21/2016 09/11/2015  PHQ - 2 Score 0 0 0 0    Cognitive Function     6CIT Screen 03/28/2018 09/21/2016  What Year? 0 points 0 points  What month? 0 points 0 points  What time? 0 points 0 points  Count back from 20 0 points 0 points  Months in reverse 0 points 0 points  Repeat phrase 0 points 0 points  Total Score 0 0    Immunization History  Administered Date(s) Administered  . Influenza,inj,Quad PF,6+ Mos 08/06/2013  . Pneumococcal Conjugate-13 09/11/2015  . Pneumococcal Polysaccharide-23 07/17/2012  . Tdap 05/02/2007    Qualifies for Shingles Vaccine? Due for Shingles vaccine. Declined my offer to administer today. Education has been provided regarding the importance of this vaccine. Pt has been advised to call her insurance company to determine her out of pocket expense. Advised she may also receive this vaccine at her local pharmacy or Health Dept. Verbalized acceptance and understanding.   Screening Tests Health Maintenance  Topic Date Due  . TETANUS/TDAP  05/01/2017  . INFLUENZA VACCINE  06/08/2018  . COLONOSCOPY  02/19/2024  . Hepatitis C Screening  Completed  . PNA vac Low Risk Adult  Completed   Cancer Screenings: Lung: Low Dose CT Chest recommended if Age 72-80 years, 30 pack-year currently smoking OR have quit  w/in 15years. Patient does qualify. An Epic message has been sent to Burgess Estelle, RN (Oncology Nurse Navigator) regarding the possible need for this exam. Raquel Sarna will review the patient's chart to determine if the patient truly qualifies for the exam. If the patient qualifies, Raquel Sarna will order the Low Dose CT of the chest to facilitate the scheduling of this exam. Colorectal: Up to date  Additional Screenings:  Hepatitis C Screening:Up to date      Plan:  I have personally reviewed and addressed the Medicare Annual Wellness questionnaire and have noted the following in the patient's chart:  A. Medical and social history B. Use of alcohol, tobacco or illicit drugs  C. Current medications and supplements D. Functional ability and status E.  Nutritional status F.  Physical activity G. Advance directives H. List of other physicians I.  Hospitalizations, surgeries, and ER visits in previous 12 months J.  [place] such as hearing and vision if needed, cognitive and depression L. Referrals and appointments - none  In addition, I have reviewed and discussed with patient certain preventive protocols, quality metrics, and best practice recommendations. A written personalized care plan for preventive services as well as general preventive health recommendations were provided to patient.  See attached scanned questionnaire for additional information.   Signed,  Fabio Neighbors, LPN Nurse Health Advisor   Nurse Recommendations: Chest CT scan referral sent to nurse navigator to set up. Pt declined the tetanus vaccine today.

## 2018-03-28 NOTE — Patient Instructions (Addendum)
David Trevino , Thank you for taking time to come for your Medicare Wellness Visit. I appreciate your ongoing commitment to your health goals. Please review the following plan we discussed and let me know if I can assist you in the future.   Screening recommendations/referrals: Colonoscopy: Up to date Recommended yearly ophthalmology/optometry visit for glaucoma screening and checkup Recommended yearly dental visit for hygiene and checkup  Vaccinations: Influenza vaccine: N/A Pneumococcal vaccine: Up to date Tdap vaccine: Pt declines today.  Shingles vaccine: Pt declines today.     Advanced directives: Advance directive discussed with you today. I have provided a copy for you to complete at home and have notarized. Once this is complete please bring a copy in to our office so we can scan it into your chart.  Conditions/risks identified: Fall risk prevention; Recommend cutting back on sugar and sweets to a couple times a week.   Next appointment: 9:40 AM today with Dr Rosanna Randy.   Preventive Care 71 Years and Older, Male Preventive care refers to lifestyle choices and visits with your health care provider that can promote health and wellness. What does preventive care include?  A yearly physical exam. This is also called an annual well check.  Dental exams once or twice a year.  Routine eye exams. Ask your health care provider how often you should have your eyes checked.  Personal lifestyle choices, including:  Daily care of your teeth and gums.  Regular physical activity.  Eating a healthy diet.  Avoiding tobacco and drug use.  Limiting alcohol use.  Practicing safe sex.  Taking low doses of aspirin every day.  Taking vitamin and mineral supplements as recommended by your health care provider. What happens during an annual well check? The services and screenings done by your health care provider during your annual well check will depend on your age, overall health,  lifestyle risk factors, and family history of disease. Counseling  Your health care provider may ask you questions about your:  Alcohol use.  Tobacco use.  Drug use.  Emotional well-being.  Home and relationship well-being.  Sexual activity.  Eating habits.  History of falls.  Memory and ability to understand (cognition).  Work and work Statistician. Screening  You may have the following tests or measurements:  Height, weight, and BMI.  Blood pressure.  Lipid and cholesterol levels. These may be checked every 5 years, or more frequently if you are over 37 years old.  Skin check.  Lung cancer screening. You may have this screening every year starting at age 36 if you have a 30-pack-year history of smoking and currently smoke or have quit within the past 15 years.  Fecal occult blood test (FOBT) of the stool. You may have this test every year starting at age 49.  Flexible sigmoidoscopy or colonoscopy. You may have a sigmoidoscopy every 5 years or a colonoscopy every 10 years starting at age 64.  Prostate cancer screening. Recommendations will vary depending on your family history and other risks.  Hepatitis C blood test.  Hepatitis B blood test.  Sexually transmitted disease (STD) testing.  Diabetes screening. This is done by checking your blood sugar (glucose) after you have not eaten for a while (fasting). You may have this done every 1-3 years.  Abdominal aortic aneurysm (AAA) screening. You may need this if you are a current or former smoker.  Osteoporosis. You may be screened starting at age 23 if you are at high risk. Talk with your health care  provider about your test results, treatment options, and if necessary, the need for more tests. Vaccines  Your health care provider may recommend certain vaccines, such as:  Influenza vaccine. This is recommended every year.  Tetanus, diphtheria, and acellular pertussis (Tdap, Td) vaccine. You may need a Td booster  every 10 years.  Zoster vaccine. You may need this after age 41.  Pneumococcal 13-valent conjugate (PCV13) vaccine. One dose is recommended after age 26.  Pneumococcal polysaccharide (PPSV23) vaccine. One dose is recommended after age 54. Talk to your health care provider about which screenings and vaccines you need and how often you need them. This information is not intended to replace advice given to you by your health care provider. Make sure you discuss any questions you have with your health care provider. Document Released: 11/21/2015 Document Revised: 07/14/2016 Document Reviewed: 08/26/2015 Elsevier Interactive Patient Education  2017 [place] Prevention in the Home Falls can cause injuries. They can happen to people of all ages. There are many things you can do to make your home safe and to help prevent falls. What can I do on the outside of my home?  Regularly fix the edges of walkways and driveways and fix any cracks.  Remove anything that might make you trip as you walk through a door, such as a raised step or threshold.  Trim any bushes or trees on the path to your home.  Use bright outdoor lighting.  Clear any walking paths of anything that might make someone trip, such as rocks or tools.  Regularly check to see if handrails are loose or broken. Make sure that both sides of any steps have handrails.  Any raised decks and porches should have guardrails on the edges.  Have any leaves, snow, or ice cleared regularly.  Use sand or salt on walking paths during winter.  Clean up any spills in your garage right away. This includes oil or grease spills. What can I do in the bathroom?  Use night lights.  Install grab bars by the toilet and in the tub and shower. Do not use towel bars as grab bars.  Use non-skid mats or decals in the tub or shower.  If you need to sit down in the shower, use a plastic, non-slip stool.  Keep the floor dry. Clean up any  water that spills on the floor as soon as it happens.  Remove soap buildup in the tub or shower regularly.  Attach bath mats securely with double-sided non-slip rug tape.  Do not have throw rugs and other things on the floor that can make you trip. What can I do in the bedroom?  Use night lights.  Make sure that you have a light by your bed that is easy to reach.  Do not use any sheets or blankets that are too big for your bed. They should not hang down onto the floor.  Have a firm chair that has side arms. You can use this for support while you get dressed.  Do not have throw rugs and other things on the floor that can make you trip. What can I do in the kitchen?  Clean up any spills right away.  Avoid walking on wet floors.  Keep items that you use a lot in easy-to-reach places.  If you need to reach something above you, use a strong step stool that has a grab bar.  Keep electrical cords out of the way.  Do not use floor polish  or wax that makes floors slippery. If you must use wax, use non-skid floor wax.  Do not have throw rugs and other things on the floor that can make you trip. What can I do with my stairs?  Do not leave any items on the stairs.  Make sure that there are handrails on both sides of the stairs and use them. Fix handrails that are broken or loose. Make sure that handrails are as long as the stairways.  Check any carpeting to make sure that it is firmly attached to the stairs. Fix any carpet that is loose or worn.  Avoid having throw rugs at the top or bottom of the stairs. If you do have throw rugs, attach them to the floor with carpet tape.  Make sure that you have a light switch at the top of the stairs and the bottom of the stairs. If you do not have them, ask someone to add them for you. What else can I do to help prevent falls?  Wear shoes that:  Do not have high heels.  Have rubber bottoms.  Are comfortable and fit you well.  Are closed  at the toe. Do not wear sandals.  If you use a stepladder:  Make sure that it is fully opened. Do not climb a closed stepladder.  Make sure that both sides of the stepladder are locked into place.  Ask someone to hold it for you, if possible.  Clearly mark and make sure that you can see:  Any grab bars or handrails.  First and last steps.  Where the edge of each step is.  Use tools that help you move around (mobility aids) if they are needed. These include:  Canes.  Walkers.  Scooters.  Crutches.  Turn on the lights when you go into a dark area. Replace any light bulbs as soon as they burn out.  Set up your furniture so you have a clear path. Avoid moving your furniture around.  If any of your floors are uneven, fix them.  If there are any pets around you, be aware of where they are.  Review your medicines with your doctor. Some medicines can make you feel dizzy. This can increase your chance of falling. Ask your doctor what other things that you can do to help prevent falls. This information is not intended to replace advice given to you by your health care provider. Make sure you discuss any questions you have with your health care provider. Document Released: 08/21/2009 Document Revised: 04/01/2016 Document Reviewed: 11/29/2014 Elsevier Interactive Patient Education  2017 [employer].

## 2018-04-04 ENCOUNTER — Ambulatory Visit (INDEPENDENT_AMBULATORY_CARE_PROVIDER_SITE_OTHER): Payer: Medicare Other | Admitting: Family Medicine

## 2018-04-04 VITALS — BP 130/84 | HR 76 | Temp 98.0°F | Resp 16 | Wt 206.0 lb

## 2018-04-04 DIAGNOSIS — R0982 Postnasal drip: Secondary | ICD-10-CM | POA: Diagnosis not present

## 2018-04-04 DIAGNOSIS — E785 Hyperlipidemia, unspecified: Secondary | ICD-10-CM | POA: Diagnosis not present

## 2018-04-04 DIAGNOSIS — J302 Other seasonal allergic rhinitis: Secondary | ICD-10-CM

## 2018-04-04 DIAGNOSIS — Z1211 Encounter for screening for malignant neoplasm of colon: Secondary | ICD-10-CM | POA: Diagnosis not present

## 2018-04-04 DIAGNOSIS — R7303 Prediabetes: Secondary | ICD-10-CM | POA: Diagnosis not present

## 2018-04-04 DIAGNOSIS — E039 Hypothyroidism, unspecified: Secondary | ICD-10-CM | POA: Diagnosis not present

## 2018-04-04 LAB — IFOBT (OCCULT BLOOD): IFOBT: NEGATIVE

## 2018-04-04 MED ORDER — LEVOTHYROXINE SODIUM 50 MCG PO TABS: 50.0000 ug | ORAL_TABLET | Freq: Every day | ORAL | 3 refills | Status: DC

## 2018-04-04 MED ORDER — LORATADINE 10 MG PO TABS: 10.0000 mg | ORAL_TABLET | Freq: Every day | ORAL | 3 refills | Status: DC

## 2018-04-04 NOTE — Progress Notes (Signed)
David Trevino  MRN: [id number] DOB: 1946-02-04  Subjective:  HPI   Patient is a 72 year old male who presents for chronic health care of his COPD, hypothyroidism borderline diabetes and hyperlipidemia.Marland Kitchen  He was last seen on 03/28/18 for his annual wellness visit with the nurse health adviser.  The patient has been checking his blood pressure and has been getting food readings.  Most of them are in the 120's over 80's range. The patient checks his glucose at home periodically and he has been getting readings in the range of 90's to 122.   Patient Active Problem List   Diagnosis Date Noted  . Borderline diabetes 04/04/2018  . Personal history of tobacco use, presenting hazards to health 09/17/2015  . Cataract 09/03/2015  . Malignant neoplasm of colon ([place]) 09/03/2015  . Chronic airway obstruction ([place]) 09/03/2015  . 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 HLD (hyperlipidemia) 09/03/2015  . Adult hypothyroidism 09/03/2015  . Detached retina 09/03/2015  . Avitaminosis D 09/03/2015    Past Medical History:  Diagnosis Date  . Hyperlipidemia   . Personal history of tobacco use, presenting hazards to health 09/17/2015    Social History   Socioeconomic History  . Marital status: Married    Spouse name: Not on file  . Number of children: 2  . Years of education: Not on file  . Highest education level: Some college, no degree  Occupational History  . Occupation: retired  Scientific laboratory technician  . Financial resource strain: Not hard at all  . Food insecurity:    Worry: Never true    Inability: Never true  . Transportation needs:    Medical: No    Non-medical: No  Tobacco Use  . Smoking status: Former Smoker    Packs/day: 1.00    Years: 40.00    Pack years: 40.00    Types: Cigarettes    Last attempt to quit: 11/21/2005    Years since quitting: 12.3  . Smokeless tobacco: Never Used  Substance and Sexual Activity  . Alcohol use: No    Comment: Quit drinking  alcohol 10/16/2005  . Drug use: No  . Sexual activity: Not on file  Lifestyle  . Physical activity:    Days per week: Not on file    Minutes per session: Not on file  . Stress: Not at all  Relationships  . Social connections:    Talks on phone: Not on file    Gets together: Not on file    Attends religious service: Not on file    Active member of club or organization: Not on file    Attends meetings of clubs or organizations: Not on file    Relationship status: Not on file  . Intimate partner violence:    Fear of current or ex partner: Not on file    Emotionally abused: Not on file    Physically abused: Not on file    Forced sexual activity: Not on file  Other Topics Concern  . Not on file  Social History Narrative  . Not on file    Outpatient Encounter Medications as of 04/04/2018  Medication Sig Note  . aspirin-acetaminophen-caffeine (EXCEDRIN MIGRAINE) 250-250-65 MG tablet Take 1 tablet by mouth every 6 (six) hours as needed for headache.   . Cholecalciferol (VITAMIN D) 2000 UNITS tablet Take 2,000 Units by mouth daily.  09/03/2015: Received from: [facility]  . HYDROcodone-acetaminophen (NORCO/VICODIN) 5-325 MG tablet Take 1 tablet by mouth every 6 (six) hours  as needed for moderate pain. (Patient not taking: Reported on 10/03/2017)   . ipratropium (ATROVENT) 0.03 % nasal spray Place 2 sprays into both nostrils every 12 (twelve) hours. (Patient taking differently: Place 2 sprays into both nostrils 2 (two) times daily. )   . levothyroxine (SYNTHROID, LEVOTHROID) 50 MCG tablet TAKE 1 TABLET BY MOUTH ONCE A DAY   . loratadine (CLARITIN) 10 MG tablet Take 1 tablet (10 mg total) by mouth daily.   . niacin 250 MG tablet Take by mouth.   . Niacin CR 250 MG TBCR Take 250 mg by mouth daily.  09/03/2015: Received from: [facility]  . Omega-3 1000 MG CAPS Take by mouth.   . omega-3 fish oil (MAXEPA) 1000 MG CAPS capsule Take 1 capsule by mouth daily.   09/03/2015: Received from: [facility]   No facility-administered encounter medications on file as of 04/04/2018.     No Known Allergies  Review of Systems  Constitutional: Negative.   HENT: Negative.   Eyes: Negative.   Respiratory: Negative.   Cardiovascular: Negative.   Gastrointestinal: Negative.   Genitourinary: Negative.   Musculoskeletal: Negative.   Skin: Negative.   Neurological: Negative.   Endo/Heme/Allergies: Negative.   Psychiatric/Behavioral: Negative.     Objective:  BP 130/84 (BP Location: Right Arm, Patient Position: Sitting, Cuff Size: Normal)   Pulse 76   Temp 98 F (36.7 C) (Oral)   Resp 16   Wt 206 lb (93.4 kg)   BMI 27.18 kg/m   Physical Exam  Constitutional: He is oriented to person, place, and time and well-developed, well-nourished, and in no distress.  HENT:  Head: Normocephalic and atraumatic.  Right Ear: External ear normal.  Left Ear: External ear normal.  Nose: Nose normal.  Mouth/Throat: Oropharynx is clear and moist.  Eyes: Conjunctivae are normal. No scleral icterus.  Neck: No thyromegaly present.  Cardiovascular: Normal rate, regular rhythm, normal heart sounds and intact distal pulses.  Pulmonary/Chest: Effort normal and breath sounds normal.  Abdominal: Soft.  Genitourinary: Rectum normal, prostate normal and penis normal.  Musculoskeletal: He exhibits no edema.  Lymphadenopathy:    He has no cervical adenopathy.  Neurological: He is alert and oriented to person, place, and time. Gait normal. GCS score is 15.  Skin: Skin is warm and dry.  Psychiatric: Mood, memory, affect and judgment normal.    Assessment and Plan :   1. Adult hypothyroidism  - CBC with Differential/Platelet - TSH - levothyroxine (SYNTHROID, LEVOTHROID) 50 MCG tablet; Take 1 tablet (50 mcg total) by mouth daily.  Dispense: 90 tablet; Refill: 3  2. Hyperlipidemia, unspecified hyperlipidemia type  - Comprehensive metabolic panel -  Lipid Panel With LDL/HDL Ratio  3. Borderline diabetes  - Comprehensive metabolic panel - Hemoglobin A1c  4. Post-nasal drip  - loratadine (CLARITIN) 10 MG tablet; Take 1 tablet (10 mg total) by mouth daily.  Dispense: 90 tablet; Refill: 3  5. Seasonal allergies   6. Colon cancer screening  - IFOBT POC (occult bld, rslt in office)  I have done the exam and reviewed the chart and it is accurate to the best of my knowledge. Development worker, community has been used and  any errors in dictation or transcription are unintentional. Miguel Aschoff M.D. [facility]

## 2018-04-05 LAB — CBC WITH DIFFERENTIAL/PLATELET
Basophils Absolute: 0 10*3/uL (ref 0.0–0.2)
Basos: 1 %
EOS (ABSOLUTE): 0.1 10*3/uL (ref 0.0–0.4)
EOS: 2 %
HEMATOCRIT: 47.4 % (ref 37.5–51.0)
Hemoglobin: 15.9 g/dL (ref 13.0–17.7)
IMMATURE GRANULOCYTES: 0 %
Immature Grans (Abs): 0 10*3/uL (ref 0.0–0.1)
Lymphocytes Absolute: 2 10*3/uL (ref 0.7–3.1)
Lymphs: 43 %
MCH: 30.2 pg (ref 26.6–33.0)
MCHC: 33.5 g/dL (ref 31.5–35.7)
MCV: 90 fL (ref 79–97)
MONOS ABS: 0.4 10*3/uL (ref 0.1–0.9)
Monocytes: 9 %
NEUTROS ABS: 2.1 10*3/uL (ref 1.4–7.0)
NEUTROS PCT: 45 %
Platelets: 235 10*3/uL (ref 150–450)
RBC: 5.26 x10E6/uL (ref 4.14–5.80)
RDW: 15.1 % (ref 12.3–15.4)
WBC: 4.7 10*3/uL (ref 3.4–10.8)

## 2018-04-05 LAB — COMPREHENSIVE METABOLIC PANEL
A/G RATIO: 1.7 (ref 1.2–2.2)
ALT: 17 IU/L (ref 0–44)
AST: 13 IU/L (ref 0–40)
Albumin: 4.5 g/dL (ref 3.5–4.8)
Alkaline Phosphatase: 72 IU/L (ref 39–117)
BILIRUBIN TOTAL: 0.4 mg/dL (ref 0.0–1.2)
BUN/Creatinine Ratio: 14 (ref 10–24)
BUN: 12 mg/dL (ref 8–27)
CALCIUM: 8.9 mg/dL (ref 8.6–10.2)
CHLORIDE: 103 mmol/L (ref 96–106)
CO2: 21 mmol/L (ref 20–29)
Creatinine, Ser: 0.88 mg/dL (ref 0.76–1.27)
GFR, EST AFRICAN AMERICAN: 101 mL/min/{1.73_m2} (ref >59)
GFR, EST NON AFRICAN AMERICAN: 87 mL/min/{1.73_m2} (ref >59)
GLOBULIN, TOTAL: 2.7 g/dL (ref 1.5–4.5)
Glucose: 107 mg/dL — ABNORMAL HIGH (ref 65–99)
POTASSIUM: 4.2 mmol/L (ref 3.5–5.2)
Sodium: 139 mmol/L (ref 134–144)
TOTAL PROTEIN: 7.2 g/dL (ref 6.0–8.5)

## 2018-04-05 LAB — TSH: TSH: 1.39 u[IU]/mL (ref 0.450–4.500)

## 2018-04-05 LAB — LIPID PANEL WITH LDL/HDL RATIO
Cholesterol, Total: 203 mg/dL — ABNORMAL HIGH (ref 100–199)
HDL: 42 mg/dL (ref >39)
LDL Calculated: 115 mg/dL — ABNORMAL HIGH (ref 0–99)
LDL/HDL RATIO: 2.7 ratio (ref 0.0–3.6)
Triglycerides: 229 mg/dL — ABNORMAL HIGH (ref 0–149)
VLDL Cholesterol Cal: 46 mg/dL — ABNORMAL HIGH (ref 5–40)

## 2018-04-05 LAB — HEMOGLOBIN A1C
ESTIMATED AVERAGE GLUCOSE: 126 mg/dL
Hgb A1c MFr Bld: 6 % — ABNORMAL HIGH (ref 4.8–5.6)

## 2018-04-12 ENCOUNTER — Telehealth: Payer: Self-pay

## 2018-04-12 NOTE — Telephone Encounter (Signed)
-----   Message from [name], MD sent at 04/11/2018  4:33 PM EDT ----- Stable.

## 2018-04-12 NOTE — Telephone Encounter (Signed)
Advised  ED 

## 2018-04-12 NOTE — Telephone Encounter (Signed)
[facility]  ED   ----- Message from [name], MD sent at 04/11/2018  4:33 PM EDT ----- Stable.

## 2018-07-12 ENCOUNTER — Ambulatory Visit
Admission: RE | Admit: 2018-07-12 | Discharge: 2018-07-12 | Disposition: A | Discharge status: Home / Self Care | Payer: Medicare Other | Source: Ambulatory Visit | Attending: Family Medicine | Admitting: Family Medicine

## 2018-07-12 ENCOUNTER — Ambulatory Visit (INDEPENDENT_AMBULATORY_CARE_PROVIDER_SITE_OTHER): Payer: Medicare Other | Admitting: Family Medicine

## 2018-07-12 VITALS — BP 131/73 | HR 90 | Temp 99.2°F | Resp 18 | Wt 202.0 lb

## 2018-07-12 DIAGNOSIS — R05 Cough: Secondary | ICD-10-CM | POA: Insufficient documentation

## 2018-07-12 DIAGNOSIS — R059 Cough, unspecified: Secondary | ICD-10-CM

## 2018-07-12 DIAGNOSIS — M791 Myalgia, unspecified site: Secondary | ICD-10-CM

## 2018-07-12 DIAGNOSIS — J449 Chronic obstructive pulmonary disease, unspecified: Secondary | ICD-10-CM | POA: Insufficient documentation

## 2018-07-12 DIAGNOSIS — R509 Fever, unspecified: Secondary | ICD-10-CM

## 2018-07-12 LAB — POCT URINALYSIS DIPSTICK
BILIRUBIN UA: NEGATIVE
Glucose, UA: POSITIVE — AB
KETONES UA: NEGATIVE
Nitrite, UA: POSITIVE
PH UA: 6.5 (ref 5.0–8.0)
PROTEIN UA: POSITIVE — AB
Urobilinogen, UA: 0.2 E.U./dL

## 2018-07-12 MED ORDER — SULFAMETHOXAZOLE-TRIMETHOPRIM 800-160 MG PO TABS: 1.0000 | ORAL_TABLET | Freq: Two times a day (BID) | ORAL | 0 refills | Status: AC

## 2018-07-12 NOTE — Progress Notes (Signed)
David Trevino  MRN: [id number] DOB: October 08, 1946  Subjective:  HPI   The patient is a 72 year old male who presents with fever, body aches, headache, cough and rash on his back.  He states that he started having symptoms yesterday and they have continued today.    Patient Active Problem List   Diagnosis Date Noted  . Borderline diabetes 04/04/2018  . Personal history of tobacco use, presenting hazards to health 09/17/2015  . Cataract 09/03/2015  . Malignant neoplasm of colon ([place]) 09/03/2015  . Chronic airway obstruction ([place]) 09/03/2015  . 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 HLD (hyperlipidemia) 09/03/2015  . Adult hypothyroidism 09/03/2015  . Detached retina 09/03/2015  . Avitaminosis D 09/03/2015    Past Medical History:  Diagnosis Date  . Hyperlipidemia   . Personal history of tobacco use, presenting hazards to health 09/17/2015    Social History   Socioeconomic History  . Marital status: Married    Spouse name: Not on file  . Number of children: 2  . Years of education: Not on file  . Highest education level: Some college, no degree  Occupational History  . Occupation: retired  Scientific laboratory technician  . Financial resource strain: Not hard at all  . Food insecurity:    Worry: Never true    Inability: Never true  . Transportation needs:    Medical: No    Non-medical: No  Tobacco Use  . Smoking status: Former Smoker    Packs/day: 1.00    Years: 40.00    Pack years: 40.00    Types: Cigarettes    Last attempt to quit: 11/21/2005    Years since quitting: 12.6  . Smokeless tobacco: Never Used  Substance and Sexual Activity  . Alcohol use: No    Comment: Quit drinking alcohol 10/16/2005  . Drug use: No  . Sexual activity: Not on file  Lifestyle  . Physical activity:    Days per week: Not on file    Minutes per session: Not on file  . Stress: Not at all  Relationships  . Social connections:    Talks on phone: Not on file    Gets together:  Not on file    Attends religious service: Not on file    Active member of club or organization: Not on file    Attends meetings of clubs or organizations: Not on file    Relationship status: Not on file  . Intimate partner violence:    Fear of current or ex partner: Not on file    Emotionally abused: Not on file    Physically abused: Not on file    Forced sexual activity: Not on file  Other Topics Concern  . Not on file  Social History Narrative  . Not on file    Outpatient Encounter Medications as of 07/12/2018  Medication Sig Note  . aspirin-acetaminophen-caffeine (EXCEDRIN MIGRAINE) 250-250-65 MG tablet Take 1 tablet by mouth every 6 (six) hours as needed for headache.   . Cholecalciferol (VITAMIN D) 2000 UNITS tablet Take 2,000 Units by mouth daily.  09/03/2015: Received from: [facility]  . ipratropium (ATROVENT) 0.03 % nasal spray Place 2 sprays into both nostrils every 12 (twelve) hours. (Patient taking differently: Place 2 sprays into both nostrils 2 (two) times daily. )   . levothyroxine (SYNTHROID, LEVOTHROID) 50 MCG tablet Take 1 tablet (50 mcg total) by mouth daily.   Marland Kitchen loratadine (CLARITIN) 10 MG tablet Take 1 tablet (10 mg total) by  mouth daily.   . Niacin CR 250 MG TBCR Take 250 mg by mouth daily.  09/03/2015: Received from: [facility]  . Omega-3 1000 MG CAPS Take by mouth.    No facility-administered encounter medications on file as of 07/12/2018.     No Known Allergies  Review of Systems  Constitutional: Positive for chills, diaphoresis, fever and malaise/fatigue.  HENT: Positive for sore throat (scratchy). Negative for congestion, ear discharge, ear pain, hearing loss, nosebleeds, sinus pain and tinnitus.   Eyes: Negative for blurred vision, double vision, photophobia, pain, discharge and redness.  Respiratory: Positive for cough and shortness of breath. Negative for sputum production and wheezing.   Cardiovascular: Negative for  chest pain, palpitations, orthopnea and leg swelling.  Gastrointestinal: Negative.   Musculoskeletal:       Body aches with soreness of his back  Skin: Positive for rash. Negative for itching.  Endo/Heme/Allergies: Negative.   Psychiatric/Behavioral: Negative.     Objective:  BP 131/73 (BP Location: Right Arm, Patient Position: Sitting, Cuff Size: Normal)   Pulse 90   Temp 99.2 F (37.3 C) (Oral)   Resp 18   Wt 202 lb (91.6 kg)   SpO2 96%   BMI 26.65 kg/m   Physical Exam  Constitutional: He is oriented to person, place, and time and well-developed, well-nourished, and in no distress.  HENT:  Head: Normocephalic and atraumatic.  Right Ear: External ear normal.  Left Ear: External ear normal.  Nose: Nose normal.  Eyes: Conjunctivae are normal. No scleral icterus.  Neck: No thyromegaly present.  Cardiovascular: Normal rate, regular rhythm and normal heart sounds.  Pulmonary/Chest: Effort normal and breath sounds normal.  Abdominal: Soft.  Musculoskeletal: He exhibits no edema.  Neurological: He is alert and oriented to person, place, and time. Gait normal. GCS score is 15.  Skin: Skin is warm and dry.  Skin moist.  Psychiatric: Mood, memory, affect and judgment normal.    Assessment and Plan :  1. Cough  - CBC with Differential/Platelet - DG Chest 2 View; Future  2. Fever, unspecified fever cause RTC 24 hours. - CBC with Differential/Platelet - Comprehensive metabolic panel - Urine Culture - POCT urinalysis dipstick - sulfamethoxazole-trimethoprim (BACTRIM DS,SEPTRA DS) 800-160 MG tablet; Take 1 tablet by mouth 2 (two) times daily for 10 days.  Dispense: 20 tablet; Refill: 0  3. Myalgia  - CK - Urine Culture  I have done the exam and reviewed the chart and it is accurate to the best of my knowledge. Development worker, community has been used and  any errors in dictation or transcription are unintentional. Miguel Aschoff M.D. [facility]

## 2018-07-13 ENCOUNTER — Ambulatory Visit (INDEPENDENT_AMBULATORY_CARE_PROVIDER_SITE_OTHER): Payer: Medicare Other | Admitting: Family Medicine

## 2018-07-13 DIAGNOSIS — N410 Acute prostatitis: Secondary | ICD-10-CM | POA: Diagnosis not present

## 2018-07-13 LAB — COMPREHENSIVE METABOLIC PANEL
A/G RATIO: 1.6 (ref 1.2–2.2)
ALBUMIN: 4.4 g/dL (ref 3.5–4.8)
ALK PHOS: 78 IU/L (ref 39–117)
ALT: 18 IU/L (ref 0–44)
AST: 17 IU/L (ref 0–40)
BILIRUBIN TOTAL: 1.2 mg/dL (ref 0.0–1.2)
BUN / CREAT RATIO: 12 (ref 10–24)
BUN: 14 mg/dL (ref 8–27)
CO2: 17 mmol/L — AB (ref 20–29)
CREATININE: 1.19 mg/dL (ref 0.76–1.27)
Calcium: 8.9 mg/dL (ref 8.6–10.2)
Chloride: 104 mmol/L (ref 96–106)
GFR calc Af Amer: 71 mL/min/{1.73_m2} (ref >59)
GFR calc non Af Amer: 61 mL/min/{1.73_m2} (ref >59)
Globulin, Total: 2.7 g/dL (ref 1.5–4.5)
Glucose: 198 mg/dL — ABNORMAL HIGH (ref 65–99)
POTASSIUM: 3.5 mmol/L (ref 3.5–5.2)
Sodium: 138 mmol/L (ref 134–144)
Total Protein: 7.1 g/dL (ref 6.0–8.5)

## 2018-07-13 LAB — CBC WITH DIFFERENTIAL/PLATELET
Basophils Absolute: 0 10*3/uL (ref 0.0–0.2)
Basos: 0 %
EOS (ABSOLUTE): 0 10*3/uL (ref 0.0–0.4)
EOS: 0 %
HEMATOCRIT: 43.3 % (ref 37.5–51.0)
HEMOGLOBIN: 14.5 g/dL (ref 13.0–17.7)
Immature Grans (Abs): 0 10*3/uL (ref 0.0–0.1)
Immature Granulocytes: 0 %
LYMPHS ABS: 1.3 10*3/uL (ref 0.7–3.1)
Lymphs: 11 %
MCH: 29.8 pg (ref 26.6–33.0)
MCHC: 33.5 g/dL (ref 31.5–35.7)
MCV: 89 fL (ref 79–97)
MONOCYTES: 10 %
MONOS ABS: 1.1 10*3/uL — AB (ref 0.1–0.9)
NEUTROS ABS: 9.5 10*3/uL — AB (ref 1.4–7.0)
Neutrophils: 79 %
Platelets: 204 10*3/uL (ref 150–450)
RBC: 4.86 x10E6/uL (ref 4.14–5.80)
RDW: 14.8 % (ref 12.3–15.4)
WBC: 11.9 10*3/uL — ABNORMAL HIGH (ref 3.4–10.8)

## 2018-07-13 LAB — CK: CK TOTAL: 83 U/L (ref 24–204)

## 2018-07-13 NOTE — Progress Notes (Signed)
David Trevino  MRN: [id number] DOB: 1946-07-24  Subjective:  HPI   The patient is a 72 year old male who presents for follow up from yesterday.  The patient came in with fever, cough, body aches and rash on his back.  He states that today he is feeling some better but is still feeling drained.  He does not think he has had any fever today.    The patient states that he was urinating a lot last night but even that has improved.  Patient Active Problem List   Diagnosis Date Noted  . Borderline diabetes 04/04/2018  . Personal history of tobacco use, presenting hazards to health 09/17/2015  . Cataract 09/03/2015  . Malignant neoplasm of colon ([place]) 09/03/2015  . Chronic airway obstruction ([place]) 09/03/2015  . 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 HLD (hyperlipidemia) 09/03/2015  . Adult hypothyroidism 09/03/2015  . Detached retina 09/03/2015  . Avitaminosis D 09/03/2015    Past Medical History:  Diagnosis Date  . Hyperlipidemia   . Personal history of tobacco use, presenting hazards to health 09/17/2015    Social History   Socioeconomic History  . Marital status: Married    Spouse name: Not on file  . Number of children: 2  . Years of education: Not on file  . Highest education level: Some college, no degree  Occupational History  . Occupation: retired  Scientific laboratory technician  . Financial resource strain: Not hard at all  . Food insecurity:    Worry: Never true    Inability: Never true  . Transportation needs:    Medical: No    Non-medical: No  Tobacco Use  . Smoking status: Former Smoker    Packs/day: 1.00    Years: 40.00    Pack years: 40.00    Types: Cigarettes    Last attempt to quit: 11/21/2005    Years since quitting: 12.6  . Smokeless tobacco: Never Used  Substance and Sexual Activity  . Alcohol use: No    Comment: Quit drinking alcohol 10/16/2005  . Drug use: No  . Sexual activity: Not on file  Lifestyle  . Physical activity:    Days  per week: Not on file    Minutes per session: Not on file  . Stress: Not at all  Relationships  . Social connections:    Talks on phone: Not on file    Gets together: Not on file    Attends religious service: Not on file    Active member of club or organization: Not on file    Attends meetings of clubs or organizations: Not on file    Relationship status: Not on file  . Intimate partner violence:    Fear of current or ex partner: Not on file    Emotionally abused: Not on file    Physically abused: Not on file    Forced sexual activity: Not on file  Other Topics Concern  . Not on file  Social History Narrative  . Not on file    Outpatient Encounter Medications as of 07/13/2018  Medication Sig Note  . aspirin-acetaminophen-caffeine (EXCEDRIN MIGRAINE) 250-250-65 MG tablet Take 1 tablet by mouth every 6 (six) hours as needed for headache.   . Cholecalciferol (VITAMIN D) 2000 UNITS tablet Take 2,000 Units by mouth daily.  09/03/2015: Received from: [facility]  . ipratropium (ATROVENT) 0.03 % nasal spray Place 2 sprays into both nostrils every 12 (twelve) hours. (Patient taking differently: Place 2 sprays into both nostrils  2 (two) times daily. )   . levothyroxine (SYNTHROID, LEVOTHROID) 50 MCG tablet Take 1 tablet (50 mcg total) by mouth daily.   Marland Kitchen loratadine (CLARITIN) 10 MG tablet Take 1 tablet (10 mg total) by mouth daily.   . Niacin CR 250 MG TBCR Take 250 mg by mouth daily.  09/03/2015: Received from: [facility]  . Omega-3 1000 MG CAPS Take by mouth.   . sulfamethoxazole-trimethoprim (BACTRIM DS,SEPTRA DS) 800-160 MG tablet Take 1 tablet by mouth 2 (two) times daily for 10 days.    No facility-administered encounter medications on file as of 07/13/2018.     No Known Allergies  Review of Systems  Constitutional: Positive for fever and malaise/fatigue.  Eyes: Negative.   Respiratory: Negative.   Cardiovascular: Negative.   Gastrointestinal:  Negative.   Musculoskeletal: Negative for back pain and myalgias.       Aches are gone but now just sore.    Skin: Negative.   Neurological: Negative.   Endo/Heme/Allergies: Negative.   Psychiatric/Behavioral: Negative.     Objective:  BP (!) 152/77 (BP Location: Right Arm, Patient Position: Sitting, Cuff Size: Normal)   Pulse 80   Temp 99.1 F (37.3 C) (Oral)   Resp 14   Wt 201 lb (91.2 kg)   BMI 26.52 kg/m   Physical Exam  Constitutional: He is oriented to person, place, and time and well-developed, well-nourished, and in no distress.  HENT:  Head: Normocephalic and atraumatic.  Eyes: Conjunctivae are normal. No scleral icterus.  Neck: No thyromegaly present.  Cardiovascular: Normal rate, regular rhythm and normal heart sounds.  Pulmonary/Chest: Effort normal and breath sounds normal.  Abdominal: Soft.  Musculoskeletal: He exhibits no edema.  Neurological: He is alert and oriented to person, place, and time. Gait normal. GCS score is 15.  Skin: Skin is warm and dry.  Psychiatric: Mood, memory, affect and judgment normal.    Assessment and Plan :  1. Acute prostatitis Starting to feel better--finish antibiotic.  I have done the exam and reviewed the chart and it is accurate to the best of my knowledge. Development worker, community has been used and  any errors in dictation or transcription are unintentional. Miguel Aschoff M.D. [facility]

## 2018-07-15 LAB — URINE CULTURE

## 2018-07-16 DIAGNOSIS — N419 Inflammatory disease of prostate, unspecified: Secondary | ICD-10-CM | POA: Insufficient documentation

## 2018-07-24 ENCOUNTER — Telehealth (INDEPENDENT_AMBULATORY_CARE_PROVIDER_SITE_OTHER): Payer: Medicare Other

## 2018-07-24 DIAGNOSIS — N410 Acute prostatitis: Secondary | ICD-10-CM

## 2018-07-24 LAB — POCT URINALYSIS DIPSTICK
BILIRUBIN UA: NEGATIVE
Blood, UA: NEGATIVE
Glucose, UA: NEGATIVE
KETONES UA: NEGATIVE
LEUKOCYTES UA: NEGATIVE
Nitrite, UA: NEGATIVE
Protein, UA: NEGATIVE
SPEC GRAV UA: 1.02 (ref 1.010–1.025)
Urobilinogen, UA: 0.2 E.U./dL
pH, UA: 7 (ref 5.0–8.0)

## 2018-07-24 NOTE — Telephone Encounter (Signed)
Patient came into the office and dropped off a urine specimen. He was treated for acute prostatitis on 07/13/18 with Doxy. He also had glucose and protein in his urine. Patient is now done with the abx, and wanted to know if this was cleared.  Patient's urine dip was WNL today. Just FYI.

## 2018-07-25 NOTE — Telephone Encounter (Signed)
thx

## 2018-08-04 ENCOUNTER — Encounter: Payer: Self-pay | Admitting: Family Medicine

## 2018-08-04 ENCOUNTER — Ambulatory Visit (INDEPENDENT_AMBULATORY_CARE_PROVIDER_SITE_OTHER): Payer: Medicare Other | Admitting: Family Medicine

## 2018-08-04 VITALS — BP 118/70 | HR 87 | Temp 98.7°F | Wt 198.0 lb

## 2018-08-04 DIAGNOSIS — L0231 Cutaneous abscess of buttock: Secondary | ICD-10-CM

## 2018-08-04 MED ORDER — CEPHALEXIN 500 MG PO CAPS: 500.0000 mg | ORAL_CAPSULE | Freq: Four times a day (QID) | ORAL | 0 refills | Status: DC

## 2018-08-04 NOTE — Patient Instructions (Signed)

## 2018-08-04 NOTE — Progress Notes (Signed)
Patient: David Trevino Male    DOB: 03-14-1946   72 y.o.   MRN: [id number] Visit Date: 08/04/2018  Today's Provider: Vernie Murders, PA   Chief Complaint  Patient presents with  . Cyst   Subjective:    HPI  Abscess: Patient presents for evaluation of a cutaneous abscess. Lesion is located in the right buttock. Onset was 1 day ago. Symptoms have gradually improved. Abscess has associated symptoms of pain. Patient does not have previous history of cutaneous abscesses. Patient does not have diabetes. Past Medical History:  Diagnosis Date  . Hyperlipidemia   . Personal history of tobacco use, presenting hazards to health 09/17/2015   Past Surgical History:  Procedure Laterality Date  . CATARACT EXTRACTION Bilateral    was done in 2 different months but unsure of which months.   . 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 HERNIA REPAIR  08/2007  . RETINAL DETACHMENT SURGERY Right 11/2010  . TONSILLECTOMY AND ADENOIDECTOMY  1960   Family History  Problem Relation Age of Onset  . Arthritis Mother   . Diabetes Mother   . Cancer Father        bladder  . Kidney disease Father   . Allergies Sister   . Cancer Brother        prostate   No Known Allergies  Current Outpatient Medications:  .  aspirin-acetaminophen-caffeine (EXCEDRIN MIGRAINE) 250-250-65 MG tablet, Take 1 tablet by mouth every 6 (six) hours as needed for headache., Disp: , Rfl:  .  Cholecalciferol (VITAMIN D) 2000 UNITS tablet, Take 2,000 Units by mouth daily. , Disp: , Rfl:  .  ipratropium (ATROVENT) 0.03 % nasal spray, Place 2 sprays into both nostrils every 12 (twelve) hours. (Patient taking differently: Place 2 sprays into both nostrils 2 (two) times daily. ), Disp: 30 mL, Rfl: 12 .  levothyroxine (SYNTHROID, LEVOTHROID) 50 MCG tablet, Take 1 tablet (50 mcg total) by mouth daily., Disp: 90 tablet, Rfl: 3 .  loratadine  (CLARITIN) 10 MG tablet, Take 1 tablet (10 mg total) by mouth daily., Disp: 90 tablet, Rfl: 3 .  Niacin CR 250 MG TBCR, Take 250 mg by mouth daily. , Disp: , Rfl:  .  Omega-3 1000 MG CAPS, Take by mouth., Disp: , Rfl:   Review of Systems  Constitutional: Negative.   HENT: Negative.   Eyes: Negative.   Respiratory: Negative.   Cardiovascular: Negative.   Genitourinary: Negative.   Skin:       Tender lump in the right buttock.    Social History   Tobacco Use  . Smoking status: Former Smoker    Packs/day: 1.00    Years: 40.00    Pack years: 40.00    Types: Cigarettes    Last attempt to quit: 11/21/2005    Years since quitting: 12.7  . Smokeless tobacco: Never Used  Substance Use Topics  . Alcohol use: No    Comment: Quit drinking alcohol 10/16/2005   Objective:   BP 118/70 (BP Location: Right Arm, Patient Position: Sitting, Cuff Size: Normal)   Pulse 87   Temp 98.7 F (37.1 C) (Oral)   Wt 198 lb (89.8 kg)   SpO2 96%   BMI 26.12 kg/m  Vitals:   08/04/18 1548  BP: 118/70  Pulse: 87  Temp: 98.7 F (37.1 C)  TempSrc: Oral  SpO2: 96%  Weight: 198 lb (  89.8 kg)   Physical Exam  Constitutional: He is oriented to person, place, and time. He appears well-developed and well-nourished. No distress.  HENT:  Head: Normocephalic and atraumatic.  Right Ear: Hearing normal.  Left Ear: Hearing normal.  Nose: Nose normal.  Eyes: Conjunctivae and lids are normal. Right eye exhibits no discharge. Left eye exhibits no discharge. No scleral icterus.  Pulmonary/Chest: Effort normal. No respiratory distress.  Musculoskeletal: Normal range of motion.  Neurological: He is alert and oriented to person, place, and time.  Skin: Skin is intact. No lesion and no rash noted. There is erythema.  Very tender erythematous area with slight induration centrally - 2-3 cm area.   Psychiatric: He has a normal mood and affect. His speech is normal and behavior is normal. Thought content normal.       Assessment & Plan:     1. Abscess of buttock, right No known injury. Onset yesterday. No fever or drainage. Will treat with antibiotic and hot Epsom [place] soaks 1-2 times a day. Recheck in 3-4 days. - cephALEXin (KEFLEX) 500 MG capsule; Take 1 capsule (500 mg total) by mouth 4 (four) times daily.  Dispense: 28 capsule; Refill: [place], [place]  [facility]

## 2018-08-07 ENCOUNTER — Encounter: Payer: Self-pay | Admitting: Family Medicine

## 2018-08-07 ENCOUNTER — Ambulatory Visit (INDEPENDENT_AMBULATORY_CARE_PROVIDER_SITE_OTHER): Payer: Medicare Other | Admitting: Family Medicine

## 2018-08-07 VITALS — BP 114/72 | HR 67 | Temp 98.1°F | Wt 200.0 lb

## 2018-08-07 DIAGNOSIS — L0232 Furuncle of buttock: Secondary | ICD-10-CM | POA: Diagnosis not present

## 2018-08-07 NOTE — Progress Notes (Signed)
Patient: David Trevino Male    DOB: 08-07-1946   72 y.o.   MRN: [id number] Visit Date: 08/07/2018  Today's Provider: Vernie Murders, PA   Chief Complaint  Patient presents with  . Abscess    Three day follow up   Subjective:    HPI   Abscess: Patient presents for evaluation of a cutaneous abscess. Lesion is located in the right buttock. Onset was 4 days ago. Symptoms have gradually improved. Abscess has associated symptoms of pain. Patient does not have previous history of cutaneous abscesses. Patient does not have diabetes. Past Medical History:  Diagnosis Date  . Hyperlipidemia   . Personal history of tobacco use, presenting hazards to health 09/17/2015   Past Surgical History:  Procedure Laterality Date  . CATARACT EXTRACTION Bilateral    was done in 2 different months but unsure of which months.   . 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 HERNIA REPAIR  08/2007  . RETINAL DETACHMENT SURGERY Right 11/2010  . TONSILLECTOMY AND ADENOIDECTOMY  1960   Family History  Problem Relation Age of Onset  . Arthritis Mother   . Diabetes Mother   . Cancer Father        bladder  . Kidney disease Father   . Allergies Sister   . Cancer Brother        prostate   No Known Allergies  Current Outpatient Medications:  .  aspirin-acetaminophen-caffeine (EXCEDRIN MIGRAINE) 250-250-65 MG tablet, Take 1 tablet by mouth every 6 (six) hours as needed for headache., Disp: , Rfl:  .  cephALEXin (KEFLEX) 500 MG capsule, Take 1 capsule (500 mg total) by mouth 4 (four) times daily., Disp: 28 capsule, Rfl: 0 .  Cholecalciferol (VITAMIN D) 2000 UNITS tablet, Take 2,000 Units by mouth daily. , Disp: , Rfl:  .  ipratropium (ATROVENT) 0.03 % nasal spray, Place 2 sprays into both nostrils every 12 (twelve) hours. (Patient taking differently: Place 2 sprays into both nostrils 2 (two) times daily. ), Disp: 30 mL,  Rfl: 12 .  levothyroxine (SYNTHROID, LEVOTHROID) 50 MCG tablet, Take 1 tablet (50 mcg total) by mouth daily., Disp: 90 tablet, Rfl: 3 .  loratadine (CLARITIN) 10 MG tablet, Take 1 tablet (10 mg total) by mouth daily., Disp: 90 tablet, Rfl: 3 .  Niacin CR 250 MG TBCR, Take 250 mg by mouth daily. , Disp: , Rfl:  .  Omega-3 1000 MG CAPS, Take by mouth., Disp: , Rfl:   Review of Systems  Constitutional: Negative.   Skin: Negative.    Social History   Tobacco Use  . Smoking status: Former Smoker    Packs/day: 1.00    Years: 40.00    Pack years: 40.00    Types: Cigarettes    Last attempt to quit: 11/21/2005    Years since quitting: 12.7  . Smokeless tobacco: Never Used  Substance Use Topics  . Alcohol use: No    Comment: Quit drinking alcohol 10/16/2005   Objective:   BP 114/72 (BP Location: Right Arm, Patient Position: Sitting, Cuff Size: Normal)   Pulse 67   Temp 98.1 F (36.7 C) (Oral)   Wt 200 lb (90.7 kg)   SpO2 97%   BMI 26.39 kg/m  Vitals:   08/07/18 0859  BP: 114/72  Pulse: 67  Temp: 98.1 F (36.7 C)  TempSrc: Oral  SpO2: 97%  Weight: 200  lb (90.7 kg)   Physical Exam  Constitutional: He is oriented to person, place, and time. He appears well-developed and well-nourished. No distress.  HENT:  Head: Normocephalic and atraumatic.  Right Ear: Hearing normal.  Left Ear: Hearing normal.  Nose: Nose normal.  Eyes: Conjunctivae and lids are normal. Right eye exhibits no discharge. Left eye exhibits no discharge. No scleral icterus.  Pulmonary/Chest: Effort normal. No respiratory distress.  Musculoskeletal: Normal range of motion.  Neurological: He is alert and oriented to person, place, and time.  Skin: Skin is intact. No lesion and no rash noted.  Furuncle at the top of the right buttock has become much smaller without erythema or tenderness. No drainage. Size down to 1 cm without surrounding erythema.  Psychiatric: He has a normal mood and affect. His speech is  normal and behavior is normal. Thought content normal.      Assessment & Plan:     1. Furuncle of buttock Healing infection in the right buttock at the sacrum. Much improved without discomfort today. Finish the Keflex and may use antibacterial bath soaps and recheck prn.       Vernie Murders, PA  [facility]

## 2018-08-17 ENCOUNTER — Ambulatory Visit (INDEPENDENT_AMBULATORY_CARE_PROVIDER_SITE_OTHER): Payer: Medicare Other | Admitting: Family Medicine

## 2018-08-17 ENCOUNTER — Encounter: Payer: Self-pay | Admitting: Family Medicine

## 2018-08-17 VITALS — BP 118/62 | HR 108 | Temp 102.4°F | Resp 20 | Wt 201.0 lb

## 2018-08-17 DIAGNOSIS — R1031 Right lower quadrant pain: Secondary | ICD-10-CM | POA: Diagnosis not present

## 2018-08-17 DIAGNOSIS — N12 Tubulo-interstitial nephritis, not specified as acute or chronic: Secondary | ICD-10-CM

## 2018-08-17 DIAGNOSIS — N3001 Acute cystitis with hematuria: Secondary | ICD-10-CM

## 2018-08-17 LAB — POCT URINALYSIS DIPSTICK
BILIRUBIN UA: NEGATIVE
Glucose, UA: NEGATIVE
Ketones, UA: NEGATIVE
NITRITE UA: NEGATIVE
PH UA: 6.5 (ref 5.0–8.0)
Protein, UA: POSITIVE — AB
SPEC GRAV UA: 1.025 (ref 1.010–1.025)
UROBILINOGEN UA: 0.2 U/dL

## 2018-08-17 MED ORDER — DOXYCYCLINE HYCLATE 100 MG PO TABS: 100.0000 mg | ORAL_TABLET | Freq: Two times a day (BID) | ORAL | 0 refills | Status: DC

## 2018-08-17 NOTE — Progress Notes (Signed)
Patient: David Trevino Male    DOB: 15-May-1946   72 y.o.   MRN: [id number] Visit Date: 08/17/2018  Today's Provider: Wilhemena Durie, MD   Chief Complaint  Patient presents with  . Urinary Tract Infection    Started last night early this morning.   Subjective:    Urinary Tract Infection   This is a new problem. The current episode started yesterday. The problem has been gradually worsening. There is no history of pyelonephritis. Associated symptoms include frequency, nausea and urgency. Pertinent negatives include no chills, discharge, flank pain, hematuria, hesitancy, possible pregnancy, sweats or vomiting.       No Known Allergies   Current Outpatient Medications:  .  aspirin-acetaminophen-caffeine (EXCEDRIN MIGRAINE) 250-250-65 MG tablet, Take 1 tablet by mouth every 6 (six) hours as needed for headache., Disp: , Rfl:  .  Cholecalciferol (VITAMIN D) 2000 UNITS tablet, Take 2,000 Units by mouth daily. , Disp: , Rfl:  .  ipratropium (ATROVENT) 0.03 % nasal spray, Place 2 sprays into both nostrils every 12 (twelve) hours. (Patient taking differently: Place 2 sprays into both nostrils 2 (two) times daily. ), Disp: 30 mL, Rfl: 12 .  levothyroxine (SYNTHROID, LEVOTHROID) 50 MCG tablet, Take 1 tablet (50 mcg total) by mouth daily., Disp: 90 tablet, Rfl: 3 .  loratadine (CLARITIN) 10 MG tablet, Take 1 tablet (10 mg total) by mouth daily., Disp: 90 tablet, Rfl: 3 .  Niacin CR 250 MG TBCR, Take 250 mg by mouth daily. , Disp: , Rfl:  .  Omega-3 1000 MG CAPS, Take by mouth., Disp: , Rfl:  .  cephALEXin (KEFLEX) 500 MG capsule, Take 1 capsule (500 mg total) by mouth 4 (four) times daily. (Patient not taking: Reported on 08/17/2018), Disp: 28 capsule, Rfl: 0  Review of Systems  Constitutional: Negative.  Negative for chills.  Respiratory: Negative.   Cardiovascular: Negative.   Gastrointestinal: Positive for abdominal pain, diarrhea and nausea. Negative for abdominal  distention, anal bleeding, blood in stool, constipation, rectal pain and vomiting.  Endocrine: Negative.   Genitourinary: Positive for dysuria, frequency and urgency. Negative for decreased urine volume, difficulty urinating, flank pain, hematuria and hesitancy.  Musculoskeletal: Positive for back pain.  Allergic/Immunologic: Negative.   Neurological: Negative for dizziness, light-headedness and headaches.  Psychiatric/Behavioral: Negative.     Social History   Tobacco Use  . Smoking status: Former Smoker    Packs/day: 1.00    Years: 40.00    Pack years: 40.00    Types: Cigarettes    Last attempt to quit: 11/21/2005    Years since quitting: 12.7  . Smokeless tobacco: Never Used  Substance Use Topics  . Alcohol use: No    Comment: Quit drinking alcohol 10/16/2005   Objective:   BP 118/62 (BP Location: Left Arm, Patient Position: Sitting, Cuff Size: Normal)   Pulse (!) 108   Temp (!) 102.4 F (39.1 C) (Oral)   Resp 20   Wt 201 lb (91.2 kg)   SpO2 95%   BMI 26.52 kg/m  Vitals:   08/17/18 1428  BP: 118/62  Pulse: (!) 108  Resp: 20  Temp: (!) 102.4 F (39.1 C)  TempSrc: Oral  SpO2: 95%  Weight: 201 lb (91.2 kg)     Physical Exam  Constitutional: He is oriented to person, place, and time. He appears well-developed and well-nourished.  HENT:  Head: Normocephalic and atraumatic.  Eyes: No scleral icterus.  Neck: No thyromegaly present.  Cardiovascular: Normal  rate, regular rhythm and normal heart sounds.  Pulmonary/Chest: Effort normal and breath sounds normal.  Abdominal: Soft. There is no tenderness.  Musculoskeletal: He exhibits no edema.  Lymphadenopathy:    He has no cervical adenopathy.  Neurological: He is alert and oriented to person, place, and time.  Skin: Skin is warm and dry.  Psychiatric: He has a normal mood and affect. His behavior is normal. Judgment and thought content normal.        Assessment & Plan:     1. Acute cystitis with  hematuria  - POCT urinalysis dipstick - CULTURE, URINE COMPREHENSIVE  2. Pyelonephritis Since this is second episode in 2 months will refer to urology. - Ambulatory referral to Urology - CBC with Differential/Platelet - Comprehensive metabolic panel - doxycycline (VIBRA-TABS) 100 MG tablet; Take 1 tablet (100 mg total) by mouth 2 (two) times daily.  Dispense: 20 tablet; Refill: 0 - US Abdomen Complete; Future  3. Right lower quadrant abdominal pain Not a surgical abdomen. - CBC with Differential/Platelet - Comprehensive metabolic panel - US Abdomen Complete; Future      I have done the exam and reviewed the above chart and it is accurate to the best of my knowledge. Development worker, community has been used in this note in any air is in the dictation or transcription are unintentional.  Wilhemena Durie, MD  [place]

## 2018-08-18 LAB — COMPREHENSIVE METABOLIC PANEL
ALK PHOS: 81 IU/L (ref 39–117)
ALT: 19 IU/L (ref 0–44)
AST: 17 IU/L (ref 0–40)
Albumin/Globulin Ratio: 1.5 (ref 1.2–2.2)
Albumin: 4.3 g/dL (ref 3.5–4.8)
BUN/Creatinine Ratio: 13 (ref 10–24)
BUN: 15 mg/dL (ref 8–27)
Bilirubin Total: 0.7 mg/dL (ref 0.0–1.2)
CALCIUM: 8.9 mg/dL (ref 8.6–10.2)
CO2: 19 mmol/L — AB (ref 20–29)
CREATININE: 1.16 mg/dL (ref 0.76–1.27)
Chloride: 100 mmol/L (ref 96–106)
GFR calc Af Amer: 73 mL/min/{1.73_m2} (ref >59)
GFR calc non Af Amer: 63 mL/min/{1.73_m2} (ref >59)
GLOBULIN, TOTAL: 2.8 g/dL (ref 1.5–4.5)
GLUCOSE: 145 mg/dL — AB (ref 65–99)
Potassium: 3.8 mmol/L (ref 3.5–5.2)
Sodium: 135 mmol/L (ref 134–144)
Total Protein: 7.1 g/dL (ref 6.0–8.5)

## 2018-08-18 LAB — CBC WITH DIFFERENTIAL/PLATELET
BASOS ABS: 0.1 10*3/uL (ref 0.0–0.2)
Basos: 0 %
EOS (ABSOLUTE): 0 10*3/uL (ref 0.0–0.4)
Eos: 0 %
HEMATOCRIT: 42.3 % (ref 37.5–51.0)
Hemoglobin: 14.7 g/dL (ref 13.0–17.7)
IMMATURE GRANULOCYTES: 1 %
Immature Grans (Abs): 0.1 10*3/uL (ref 0.0–0.1)
LYMPHS ABS: 0.9 10*3/uL (ref 0.7–3.1)
Lymphs: 7 %
MCH: 30.2 pg (ref 26.6–33.0)
MCHC: 34.8 g/dL (ref 31.5–35.7)
MCV: 87 fL (ref 79–97)
Monocytes Absolute: 1.2 10*3/uL — ABNORMAL HIGH (ref 0.1–0.9)
Monocytes: 10 %
Neutrophils Absolute: 10.3 10*3/uL — ABNORMAL HIGH (ref 1.4–7.0)
Neutrophils: 82 %
Platelets: 253 10*3/uL (ref 150–450)
RBC: 4.86 x10E6/uL (ref 4.14–5.80)
RDW: 14 % (ref 12.3–15.4)
WBC: 12.5 10*3/uL — ABNORMAL HIGH (ref 3.4–10.8)

## 2018-08-19 LAB — CULTURE, URINE COMPREHENSIVE

## 2018-08-21 ENCOUNTER — Ambulatory Visit (INDEPENDENT_AMBULATORY_CARE_PROVIDER_SITE_OTHER): Payer: Medicare Other | Admitting: Urology

## 2018-08-21 ENCOUNTER — Encounter: Payer: Self-pay | Admitting: Urology

## 2018-08-21 VITALS — BP 143/97 | HR 76 | Ht 74.0 in | Wt 200.8 lb

## 2018-08-21 DIAGNOSIS — N390 Urinary tract infection, site not specified: Secondary | ICD-10-CM

## 2018-08-21 LAB — BLADDER SCAN AMB NON-IMAGING: SCAN RESULT: 0

## 2018-08-21 NOTE — Patient Instructions (Signed)
                                             Urinary Tract Infection Prevention Patient Education Stay Hydrated: Urinary tract infections (UTIs) are less likely to occur in someone who is drinking enough water to promote regular urination, so it is very important to stay hydrated in order to help flush out bacteria from the urinary tract. Respond to "Nature's Call": It is always a good idea to urinate as soon as you feel the need. While "holding it in" does not directly cause an infection, it can cause overdistension that can damage the lining of the bladder, making it more vulnerable to bacteria Practice Proper Bathroom Hygiene: To keep bacteria near the urethral opening to a minimum, it is important to practice proper wiping techniques (i.e. front to back wiping) to help prevent rectal bacteria from entering the uretro-genital area. It can also be helpful to take showers and avoid soaking in the bathtub.  Take a Vitamin C Supplement: About 1,000 milligrams of vitamin C taken daily can help inhibit the growth of some bacteria by acidifying the urine. Maintain Control with Cranberries: Cranberries contain hippuronic acid, which is a natural antiseptic that may help prevent the adherence of bacteria to the bladder lining. Drinking 100% pure cranberry juice or taking over the counter cranberry supplements twice daily may help to prevent an infection. However, it is important to note that cranberry juices/supplements are not helpful once a urinary tract infection (UTI) is present. Strengthen Your Core: Often, a lazy bladder (unable to empty urine properly) occurs due to lower back problem, so consider doing exercises to help strengthen your back, pelvic floor, and stomach muscles.  Pay Attention to Your Urine: Your urine can change color for a variety of reasons, including from the medications you take, so pay close attention to it to monitor your overall health. One key thing to note is that if your urine is  typically a darker yellow, your body is dehydrated, so you need to step up your water intake.   Activa yogurt

## 2018-08-21 NOTE — Progress Notes (Signed)
08/21/2018 9:40 AM   David Trevino [birth year] [id number]  Referring provider: [name], MD [address] [place], [place] [address]  Chief Complaint  Patient presents with  . Pyelonephritis    HPI: Patient is 72 year old Caucasian male who was referred by Dr. Miguel Aschoff for acute prostatitis with his wife, [name].    He presented to Dr. Alben Spittle office on 07/12/2018 with the complaints of fevers, body aches, headaches, cough and rash.  His urine culture was positive for E.Coli, WBC count was 11.9 and creatinine was 1.19.  He was started on Septra DS for ten days for which the E. coli was sensitive.  He had a recheck on 07/24/2018 and urine dip was negative.    He then presented on 08/04/2018 for a furuncle on his buttocks.  He was started on Kelfex for seven days.    He then presented on 08/17/2018 to his PCP's office with the complaint of dysuria.  His urine culture was positive for E. Coli.  He was prescribed doxycycline for which the E. coli was sensitive.  He was then referred to [place].  Today, he is experiencing light dysuria, nocturia x q 2 hours, intermittency and hesitancy.  His PVR is 0 mL.  He will complete his antibiotic on Friday.  Patient denies any gross hematuria or suprapubic/flank pain.  Patient denies any fevers, chills, nausea or vomiting.   He denies constipation and diarrhea.    He drinks several glasses of water daily.  He drinks a diet [employer] or diet Pepsi occasionally.  No teas.  No juices.  Occasional coffee.  No alcohol.    He does not soak in the tub or use a hot tube.    He has not had a kidney stone.    PMH: Past Medical History:  Diagnosis Date  . Colon cancer ([place])   . Hyperlipidemia   . Personal history of tobacco use, presenting hazards to health 09/17/2015    Surgical History: Past Surgical History:  Procedure Laterality Date  . CATARACT EXTRACTION Bilateral    was done in 2 different months  but unsure of which months.   . 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 COLON SURGERY     removal of partial colon  . HERNIA REPAIR  08/2007  . HERNIA REPAIR Right    lower right  . RETINAL DETACHMENT SURGERY Right 11/2010  . TONSILLECTOMY AND ADENOIDECTOMY  1960    Home Medications:  Allergies as of 08/21/2018   No Known Allergies     Medication List        Accurate as of 08/21/18  9:40 AM. Always use your most recent med list.          aspirin-acetaminophen-caffeine 250-250-65 MG tablet Commonly known as:  EXCEDRIN MIGRAINE Take 1 tablet by mouth every 6 (six) hours as needed for headache.   doxycycline 100 MG tablet Commonly known as:  VIBRA-TABS Take 1 tablet (100 mg total) by mouth 2 (two) times daily.   ipratropium 0.03 % nasal spray Commonly known as:  ATROVENT Place 2 sprays into both nostrils every 12 (twelve) hours.   levothyroxine 50 MCG tablet Commonly known as:  SYNTHROID, LEVOTHROID Take 1 tablet (50 mcg total) by mouth daily.   loratadine 10 MG tablet Commonly known as:  CLARITIN Take 1 tablet (10 mg total) by mouth daily.  Niacin CR 250 MG Tbcr Take 250 mg by mouth daily.   Omega-3 1000 MG Caps Take by mouth.   Vitamin D 2000 units tablet Take 2,000 Units by mouth daily.       Allergies: No Known Allergies  Family History: Family History  Problem Relation Age of Onset  . Arthritis Mother   . Diabetes Mother   . Chronic Renal Failure Mother   . Cancer Father        bladder  . Kidney disease Father   . Bladder Cancer Father   . Chronic Renal Failure Father   . Allergies Sister   . Cancer Brother        prostate    Social History:  reports that he quit smoking about 12 years ago. His smoking use included cigarettes. He has a 40.00 pack-year smoking history. He has never used smokeless tobacco. He reports that he does not drink alcohol or use  drugs.  ROS: UROLOGY Frequent Urination?: Yes Hard to postpone urination?: No Burning/pain with urination?: Yes Get up at night to urinate?: Yes Leakage of urine?: No Urine stream starts and stops?: Yes Trouble starting stream?: Yes Do you have to strain to urinate?: No Blood in urine?: No Urinary tract infection?: Yes Sexually transmitted disease?: No Injury to kidneys or bladder?: No Painful intercourse?: No Weak stream?: No Erection problems?: Yes Penile pain?: No  Gastrointestinal Nausea?: No Vomiting?: No Indigestion/heartburn?: No Diarrhea?: No Constipation?: No  Constitutional Fever: Yes Night sweats?: No Weight loss?: No Fatigue?: No  Skin Skin rash/lesions?: No Itching?: No  Eyes Blurred vision?: No Double vision?: No  Ears/Nose/Throat Sore throat?: No Sinus problems?: No  Hematologic/Lymphatic Swollen glands?: No Easy bruising?: Yes  Cardiovascular Leg swelling?: No Chest pain?: No  Respiratory Cough?: No Shortness of breath?: Yes  Endocrine Excessive thirst?: No  Musculoskeletal Back pain?: No Joint pain?: No  Neurological Headaches?: No Dizziness?: No  Psychologic Depression?: No Anxiety?: No  Physical Exam: BP (!) 143/97 (BP Location: Left Arm, Patient Position: Sitting, Cuff Size: Normal)   Pulse 76   Ht _0  (1.88 m)   Wt 200 lb 12.8 oz (91.1 kg)   BMI 25.78 kg/m   Constitutional:  Well nourished. Alert and oriented, No acute distress. HEENT: [place] AT, moist mucus membranes.  Trachea midline, no masses. Cardiovascular: No clubbing, cyanosis, or edema. Respiratory: Normal respiratory effort, no increased work of breathing. GI: Abdomen is soft, non tender, non distended, no abdominal masses. Liver and spleen not palpable.  No hernias appreciated.  Stool sample for occult testing is not indicated.   GU: No CVA tenderness.  No bladder fullness or masses.  Patient with uncircumcised phallus.  Foreskin easily retracted  Urethral meatus is patent.  No penile discharge. No penile lesions or rashes. Scrotum without lesions, cysts, rashes and/or edema.  Testicles are located scrotally bilaterally. No masses are appreciated in the testicles. Left and right epididymis are normal. Rectal: Patient with  normal sphincter tone. Anus and perineum without scarring or rashes. No rectal masses are appreciated. Prostate is approximately 45 grams, no nodules are appreciated. Seminal vesicles are normal. Skin: No rashes, bruises or suspicious lesions. Lymph: No cervical or inguinal adenopathy. Neurologic: Grossly intact, no focal deficits, moving all 4 extremities. Psychiatric: Normal mood and affect.  Laboratory Data: Lab Results  Component Value Date   WBC 12.5 (H) 08/17/2018   HGB 14.7 08/17/2018   HCT 42.3 08/17/2018   MCV 87 08/17/2018   PLT 253 08/17/2018  Lab Results  Component Value Date   CREATININE 1.16 08/17/2018    Lab Results  Component Value Date   PSA 1.5 02/20/2015    No results found for: TESTOSTERONE  Lab Results  Component Value Date   HGBA1C 6.0 (H) 04/04/2018    Lab Results  Component Value Date   TSH 1.390 04/04/2018       Component Value Date/Time   CHOL 203 (H) 04/04/2018 1040   HDL 42 04/04/2018 1040   LDLCALC 115 (H) 04/04/2018 1040    Lab Results  Component Value Date   AST 17 08/17/2018   Lab Results  Component Value Date   ALT 19 08/17/2018   No components found for: ALKALINEPHOPHATASE No components found for: BILIRUBINTOTAL  No results found for: ESTRADIOL  Urinalysis    Component Value Date/Time   BILIRUBINUR Negative 08/17/2018 1452   PROTEINUR Positive (A) 08/17/2018 1452   UROBILINOGEN 0.2 08/17/2018 1452   NITRITE Negative 08/17/2018 1452   LEUKOCYTESUR Large (3+) (A) 08/17/2018 1452  I have reviewed the labs.   Pertinent Imaging: Results for FAUST, THORINGTON (MRN [id number]) as of 08/21/2018 13:23  Ref. Range 08/21/2018 09:19  Scan  Result Unknown 0    Assessment & Plan:    1. rUTI's Criteria for recurrent UTI has been met with 2 or more infections in 6 months or 3 or greater infections in one year  Patient is instructed to increase their water intake until the urine is pale yellow or clear (10 to 12 cups daily) Patient is instructed to take probiotics (yogurt, oral pills or vaginal suppositories), take cranberry pills or drink the juice and Vitamin C 1,000 mg daily to acidify the urine  Will recheck a urinalysis approximately 1 week after completion of his antibiotics to evaluate for microscopic hematuria or other abnormalities -if hematuria persists we will need to pursue a CT urogram and cystoscopy He also has an abdominal ultrasound scheduled for tomorrow ordered by Dr. Rosanna Randy                                         Return for UA only on 10/21 .  These notes generated with voice recognition software. I apologize for typographical errors.  Zara Council, PA-C  [facility] [address]  [place], [place] [address] [phone]

## 2018-08-22 ENCOUNTER — Ambulatory Visit
Admission: RE | Admit: 2018-08-22 | Discharge: 2018-08-22 | Disposition: A | Discharge status: Home / Self Care | Payer: Medicare Other | Source: Ambulatory Visit | Attending: Family Medicine | Admitting: Family Medicine

## 2018-08-22 DIAGNOSIS — K8020 Calculus of gallbladder without cholecystitis without obstruction: Secondary | ICD-10-CM | POA: Diagnosis not present

## 2018-08-22 DIAGNOSIS — R1031 Right lower quadrant pain: Secondary | ICD-10-CM | POA: Insufficient documentation

## 2018-08-22 DIAGNOSIS — N12 Tubulo-interstitial nephritis, not specified as acute or chronic: Secondary | ICD-10-CM

## 2018-08-22 DIAGNOSIS — N281 Cyst of kidney, acquired: Secondary | ICD-10-CM | POA: Insufficient documentation

## 2018-08-25 ENCOUNTER — Other Ambulatory Visit: Payer: Self-pay | Admitting: Family Medicine

## 2018-08-25 DIAGNOSIS — N390 Urinary tract infection, site not specified: Secondary | ICD-10-CM

## 2018-08-28 ENCOUNTER — Other Ambulatory Visit: Payer: Medicare Other

## 2018-08-28 DIAGNOSIS — N390 Urinary tract infection, site not specified: Secondary | ICD-10-CM | POA: Diagnosis not present

## 2018-08-28 LAB — URINALYSIS, COMPLETE
BILIRUBIN UA: NEGATIVE
Glucose, UA: NEGATIVE
KETONES UA: NEGATIVE
LEUKOCYTES UA: NEGATIVE
Nitrite, UA: NEGATIVE
PH UA: 6 (ref 5.0–7.5)
PROTEIN UA: NEGATIVE
RBC UA: NEGATIVE
Specific Gravity, UA: 1.015 (ref 1.005–1.030)
UUROB: 0.2 mg/dL (ref 0.2–1.0)

## 2018-09-03 ENCOUNTER — Telehealth: Payer: Self-pay | Admitting: Urology

## 2018-09-03 NOTE — Telephone Encounter (Signed)
Please let David Trevino know that his ultrasound did not identify a cause for his UTI's.  His repeat UA was negative for clear.  Is he still having symptoms?

## 2018-09-04 NOTE — Telephone Encounter (Signed)
Patient notified, he is not having any symptoms at this time.

## 2018-09-08 ENCOUNTER — Ambulatory Visit (INDEPENDENT_AMBULATORY_CARE_PROVIDER_SITE_OTHER): Payer: Medicare Other

## 2018-09-08 VITALS — BP 133/78 | HR 78 | Ht 74.0 in | Wt 200.0 lb

## 2018-09-08 DIAGNOSIS — N390 Urinary tract infection, site not specified: Secondary | ICD-10-CM

## 2018-09-08 LAB — URINALYSIS, COMPLETE
Glucose, UA: NEGATIVE
Nitrite, UA: NEGATIVE
PH UA: 5.5 (ref 5.0–7.5)
RBC, UA: NEGATIVE
Specific Gravity, UA: 1.02 (ref 1.005–1.030)
UUROB: 2 mg/dL — AB (ref 0.2–1.0)

## 2018-09-08 LAB — MICROSCOPIC EXAMINATION: Epithelial Cells (non renal): NONE SEEN /hpf (ref 0–10)

## 2018-09-08 MED ORDER — SULFAMETHOXAZOLE-TRIMETHOPRIM 800-160 MG PO TABS: 1.0000 | ORAL_TABLET | Freq: Two times a day (BID) | ORAL | 0 refills | Status: DC

## 2018-09-08 NOTE — Progress Notes (Signed)
Patient present today complaining of urinary frequency, dysuria, and gross hematuria. Urine today does look positive for infection per Dr. Bernardo Heater patient was started on Bactrim DS and urine will be sent for culture and will call with results

## 2018-09-11 LAB — CULTURE, URINE COMPREHENSIVE

## 2018-09-12 NOTE — Telephone Encounter (Signed)
David Trevino had another UTI.  We should see him again in 2 to 3 weeks.  We will need to run more tests to see why he continues to get infections.

## 2018-09-12 NOTE — Telephone Encounter (Signed)
Patient notified.  appt scheduled

## 2018-09-27 ENCOUNTER — Ambulatory Visit (INDEPENDENT_AMBULATORY_CARE_PROVIDER_SITE_OTHER): Payer: Medicare Other | Admitting: Family Medicine

## 2018-09-27 ENCOUNTER — Encounter: Payer: Self-pay | Admitting: Urology

## 2018-09-27 ENCOUNTER — Telehealth: Payer: Self-pay

## 2018-09-27 ENCOUNTER — Ambulatory Visit (INDEPENDENT_AMBULATORY_CARE_PROVIDER_SITE_OTHER): Payer: Medicare Other | Admitting: Urology

## 2018-09-27 ENCOUNTER — Ambulatory Visit
Admission: RE | Admit: 2018-09-27 | Discharge: 2018-09-27 | Disposition: A | Discharge status: Home / Self Care | Payer: Medicare Other | Source: Ambulatory Visit | Attending: Family Medicine | Admitting: Family Medicine

## 2018-09-27 VITALS — BP 120/70 | HR 67 | Temp 97.7°F | Resp 16 | Wt 204.0 lb

## 2018-09-27 VITALS — BP 141/84 | HR 80 | Ht 74.0 in | Wt 200.1 lb

## 2018-09-27 DIAGNOSIS — E785 Hyperlipidemia, unspecified: Secondary | ICD-10-CM | POA: Diagnosis not present

## 2018-09-27 DIAGNOSIS — N411 Chronic prostatitis: Secondary | ICD-10-CM

## 2018-09-27 DIAGNOSIS — M19071 Primary osteoarthritis, right ankle and foot: Secondary | ICD-10-CM | POA: Insufficient documentation

## 2018-09-27 DIAGNOSIS — E039 Hypothyroidism, unspecified: Secondary | ICD-10-CM | POA: Diagnosis not present

## 2018-09-27 DIAGNOSIS — R310 Gross hematuria: Secondary | ICD-10-CM

## 2018-09-27 DIAGNOSIS — M79671 Pain in right foot: Secondary | ICD-10-CM

## 2018-09-27 DIAGNOSIS — Z23 Encounter for immunization: Secondary | ICD-10-CM | POA: Diagnosis not present

## 2018-09-27 DIAGNOSIS — R7303 Prediabetes: Secondary | ICD-10-CM

## 2018-09-27 DIAGNOSIS — N390 Urinary tract infection, site not specified: Secondary | ICD-10-CM

## 2018-09-27 DIAGNOSIS — M79673 Pain in unspecified foot: Secondary | ICD-10-CM | POA: Diagnosis present

## 2018-09-27 LAB — URINALYSIS, COMPLETE
BILIRUBIN UA: NEGATIVE
GLUCOSE, UA: NEGATIVE
KETONES UA: NEGATIVE
LEUKOCYTES UA: NEGATIVE
Nitrite, UA: NEGATIVE
PH UA: 6.5 (ref 5.0–7.5)
PROTEIN UA: NEGATIVE
RBC, UA: NEGATIVE
Specific Gravity, UA: 1.01 (ref 1.005–1.030)
Urobilinogen, Ur: 0.2 mg/dL (ref 0.2–1.0)

## 2018-09-27 LAB — POCT GLYCOSYLATED HEMOGLOBIN (HGB A1C): HEMOGLOBIN A1C: 5.7 % — AB (ref 4.0–5.6)

## 2018-09-27 MED ORDER — MELOXICAM 15 MG PO TABS: 15.0000 mg | ORAL_TABLET | Freq: Every day | ORAL | 0 refills | Status: DC | PRN

## 2018-09-27 NOTE — Telephone Encounter (Signed)
-----   Message from [name], MD sent at 09/27/2018 12:01 PM EST ----- Arthritis of foot.

## 2018-09-27 NOTE — Progress Notes (Signed)
09/27/2018 11:01 AM   David Trevino 10/03/1946 [id number]  Referring provider: Jerrol Trevino., MD [address] [place], [place] [address]  Chief Complaint  Patient presents with  . Follow-up  . Recurrent UTI    HPI: Patient is a 72 year old Caucasian male with a history of rUTI's and BPH with LU TS who presents today for further evaluation.    Background history Patient is 72 year old Caucasian male who was referred by Dr. Miguel Trevino for acute prostatitis with his wife, David Trevino.  He presented to Dr. Alben Trevino office on 07/12/2018 with the complaints of fevers, body aches, headaches, cough and rash.  His urine culture was positive for E.Coli, WBC count was 11.9 and creatinine was 1.19.  He was started on Septra DS for ten days for which the E. coli was sensitive.  He had a recheck on 07/24/2018 and urine dip was negative.  He then presented on 08/04/2018 for a furuncle on his buttocks.  He was started on Kelfex for seven days.  He then presented on 08/17/2018 to his PCP's office with the complaint of dysuria.  His urine culture was positive for E. Coli.  He was prescribed doxycycline for which the E. coli was sensitive.  He was then referred to [place].   He denies constipation and diarrhea.  He drinks several glasses of water daily.  He drinks a diet [employer] or diet Pepsi occasionally.  No teas.  No juices.  Occasional coffee.  No alcohol.  He does not soak in the tub or use a hot tube.  He has not had a kidney stone.    He then presented to the office on 09/08/2018 with the complaints of urinary frequency, right flank, dysuria and gross hematuria.  His UA was positive for > 30 WBC's and > 30 RBC's.  His urine culture was positive for E.coli which was pan sensitive.    Today, he is feeling well.   His UA is clear.  Patient denies any gross hematuria, dysuria or suprapubic/flank pain.  Patient denies any fevers, chills, nausea or vomiting.  He has started to  implement preventative strategies such as eating yogurt and drinking cranberry juice daily.     PMH: Past Medical History:  Diagnosis Date  . Colon cancer ([place])   . Hyperlipidemia   . Personal history of tobacco use, presenting hazards to health 09/17/2015    Surgical History: Past Surgical History:  Procedure Laterality Date  . CATARACT EXTRACTION Bilateral    was done in 2 different months but unsure of which months.   . 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 COLON SURGERY     removal of partial colon  . HERNIA REPAIR  08/2007  . HERNIA REPAIR Right    lower right  . RETINAL DETACHMENT SURGERY Right 11/2010  . TONSILLECTOMY AND ADENOIDECTOMY  1960    Home Medications:  Allergies as of 09/27/2018   No Known Allergies     Medication List        Accurate as of 09/27/18 11:01 AM. Always use your most recent med list.          aspirin-acetaminophen-caffeine 250-250-65 MG tablet Commonly known as:  EXCEDRIN MIGRAINE Take 1 tablet by mouth every 6 (six) hours as needed for headache.   ipratropium 0.03 % nasal spray Commonly known as:  ATROVENT Place 2 sprays into  both nostrils every 12 (twelve) hours.   levothyroxine 50 MCG tablet Commonly known as:  SYNTHROID, LEVOTHROID Take 1 tablet (50 mcg total) by mouth daily.   loratadine 10 MG tablet Commonly known as:  CLARITIN Take 1 tablet (10 mg total) by mouth daily.   meloxicam 15 MG tablet Commonly known as:  MOBIC Take 1 tablet (15 mg total) by mouth daily as needed for pain.   Niacin CR 250 MG Tbcr Take 250 mg by mouth daily.   Omega-3 1000 MG Caps Take by mouth.   Vitamin D 50 MCG (2000 UT) tablet Take 2,000 Units by mouth daily.       Allergies: No Known Allergies  Family History: Family History  Problem Relation Age of Onset  . Arthritis Mother   . Diabetes Mother   . Chronic Renal Failure Mother   . Cancer Father          bladder  . Kidney disease Father   . Bladder Cancer Father   . Chronic Renal Failure Father   . Allergies Sister   . Cancer Brother        prostate    Social History:  reports that he quit smoking about 12 years ago. His smoking use included cigarettes. He has a 40.00 pack-year smoking history. He has never used smokeless tobacco. He reports that he does not drink alcohol or use drugs.  ROS: UROLOGY Frequent Urination?: No Hard to postpone urination?: No Burning/pain with urination?: Yes Get up at night to urinate?: No Leakage of urine?: No Urine stream starts and stops?: No Trouble starting stream?: No Do you have to strain to urinate?: No Blood in urine?: No Urinary tract infection?: No Sexually transmitted disease?: No Injury to kidneys or bladder?: No Painful intercourse?: No Weak stream?: No Erection problems?: No Penile pain?: No  Gastrointestinal Nausea?: No Vomiting?: No Indigestion/heartburn?: No Diarrhea?: No Constipation?: No  Constitutional Fever: No Night sweats?: No Fatigue?: No  Skin Skin rash/lesions?: No Itching?: No  Eyes Blurred vision?: No  Ears/Nose/Throat Sore throat?: No Sinus problems?: No  Hematologic/Lymphatic Swollen glands?: No  Cardiovascular Leg swelling?: No Chest pain?: No  Respiratory Cough?: No Shortness of breath?: No  Endocrine Excessive thirst?: No  Musculoskeletal Back pain?: No Joint pain?: No  Neurological Headaches?: No Dizziness?: No  Psychologic Depression?: No Anxiety?: No  Physical Exam: BP (!) 141/84 (BP Location: Left Arm, Patient Position: Sitting, Cuff Size: Normal)   Pulse 80   Ht 6' 2"  (1.88 m)   Wt 200 lb 1.6 oz (90.8 kg)   BMI 25.69 kg/m   Constitutional: Well nourished. Alert and oriented, No acute distress. HEENT: [place] AT, moist mucus membranes. Trachea midline, no masses. Cardiovascular: No clubbing, cyanosis, or edema. Respiratory: Normal respiratory effort, no  increased work of breathing. Skin: No rashes, bruises or suspicious lesions. Lymph: No cervical or inguinal adenopathy. Neurologic: Grossly intact, no focal deficits, moving all 4 extremities. Psychiatric: Normal mood and affect.  Laboratory Data: Lab Results  Component Value Date   WBC 12.5 (H) 08/17/2018   HGB 14.7 08/17/2018   HCT 42.3 08/17/2018   MCV 87 08/17/2018   PLT 253 08/17/2018    Lab Results  Component Value Date   CREATININE 1.16 08/17/2018    Lab Results  Component Value Date   PSA 1.5 02/20/2015    No results found for: TESTOSTERONE  Lab Results  Component Value Date   HGBA1C 5.7 (A) 09/27/2018    Lab Results  Component Value Date  TSH 1.390 04/04/2018       Component Value Date/Time   CHOL 203 (H) 04/04/2018 1040   HDL 42 04/04/2018 1040   LDLCALC 115 (H) 04/04/2018 1040    Lab Results  Component Value Date   AST 17 08/17/2018   Lab Results  Component Value Date   ALT 19 08/17/2018   No components found for: ALKALINEPHOPHATASE No components found for: BILIRUBINTOTAL  No results found for: ESTRADIOL  Urinalysis    Component Value Date/Time   APPEARANCEUR Cloudy (A) 09/08/2018 1426   GLUCOSEU Negative 09/08/2018 1426   BILIRUBINUR 3+ (A) 09/08/2018 1426   PROTEINUR 3+ (A) 09/08/2018 1426   UROBILINOGEN 0.2 08/17/2018 1452   NITRITE Negative 09/08/2018 1426   LEUKOCYTESUR 3+ (A) 09/08/2018 1426  I have reviewed the labs.    Assessment & Plan:    1. rUTI's Criteria for recurrent UTI has been met with 2 or more infections in 6 months or 3 or greater infections in one year  Patient to continue his preventative strategies  Will obtain a CTU for further work up  2. Gross hematuria I explained to the patient that there are a number of causes that can be associated with blood in the urine, such as stones, BPH, UTI's, damage to the urinary tract and/or cancer - likely due to infections, but he has had three documented UTI's since  08/2018 that were symptomatic resulting in the need for further work up At this time, I felt that the patient warranted further urologic evaluation with 3 or greater RBC's/hpf on microscopic evaluation of the urine.  The AUA guidelines state that a CT urogram is the preferred imaging study to evaluate hematuria. I explained to the patient that a contrast material will be injected into a vein and that in rare instances, an allergic reaction can result and may even life threatening   The patient denies any allergies to contrast, iodine and/or seafood and is not taking metformin Following the imaging study,  I've recommended a cystoscopy. I described how this is performed, typically in an office setting with a flexible cystoscope. We described the risks, benefits, and possible side effects, the most common of which is a minor amount of blood in the urine and/or burning which usually resolves in 24 to 48 hours.   The patient had the opportunity to ask questions which were answered. Based upon this discussion, the patient is willing to proceed. Therefore, I've ordered: a CT Urogram and cystoscopy. The patient will return following all of the above for discussion of the results.  Patient to return for report He is to contact us for symptoms of an UTI                            Return for CTU report .  These notes generated with voice recognition software. I apologize for typographical errors.  Zara Council, PA-C  [facility] [address]  [place], [place] [address] [phone]

## 2018-09-27 NOTE — Telephone Encounter (Signed)
Patient advised he asked if he should continue the meloxicam for pain/discomfort in foot or is there something he can take for arthritis? KW

## 2018-09-27 NOTE — Progress Notes (Signed)
David Trevino  MRN: [id number] DOB: September 22, 1946  Subjective:  HPI   The patient is a 72 year old male who presents for follow up of chronic health.  He was last seen on 04/04/18 for chronic health and had several acute visits since that time.  Patient does not smoke cigarettes.  Hypothyroidism-patient has been stable on his current dose of Levothyroxine 50 mcg daily. Lab Results  Component Value Date   TSH 1.390 04/04/2018   Hyperlipidemia-the patient had his lipid levels checked on 04/04/18 and they were slightly worse than the previous 6 months.  He is currently on Niacin and Omega 3 only.   Lab Results  Component Value Date   CHOL 203 (H) 04/04/2018   HDL 42 04/04/2018   LDLCALC 115 (H) 04/04/2018   TRIG 229 (H) 04/04/2018    Borderline diabetes-Patient has been checking his glucose at home on occasion.  He gets readings that range 90-110's.   Lab Results  Component Value Date   HGBA1C 6.0 (H) 04/04/2018    Patient Active Problem List   Diagnosis Date Noted  . Prostatitis 07/16/2018  . Borderline diabetes 04/04/2018  . Personal history of tobacco use, presenting hazards to health 09/17/2015  . Cataract 09/03/2015  . Malignant neoplasm of colon ([place]) 09/03/2015  . Chronic airway obstruction ([place]) 09/03/2015  . 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 HLD (hyperlipidemia) 09/03/2015  . Adult hypothyroidism 09/03/2015  . Detached retina 09/03/2015  . Avitaminosis D 09/03/2015    Past Medical History:  Diagnosis Date  . Colon cancer ([place])   . Hyperlipidemia   . Personal history of tobacco use, presenting hazards to health 09/17/2015    Social History   Socioeconomic History  . Marital status: Married    Spouse name: Not on file  . Number of children: 2  . Years of education: Not on file  . Highest education level: Some college, no degree  Occupational History  . Occupation: retired  Scientific laboratory technician  . Financial resource strain: Not hard at  all  . Food insecurity:    Worry: Never true    Inability: Never true  . Transportation needs:    Medical: No    Non-medical: No  Tobacco Use  . Smoking status: Former Smoker    Packs/day: 1.00    Years: 40.00    Pack years: 40.00    Types: Cigarettes    Last attempt to quit: 11/21/2005    Years since quitting: 12.8  . Smokeless tobacco: Never Used  Substance and Sexual Activity  . Alcohol use: No    Comment: Quit drinking alcohol 10/16/2005  . Drug use: No  . Sexual activity: Not on file  Lifestyle  . Physical activity:    Days per week: Not on file    Minutes per session: Not on file  . Stress: Not at all  Relationships  . Social connections:    Talks on phone: Not on file    Gets together: Not on file    Attends religious service: Not on file    Active member of club or organization: Not on file    Attends meetings of clubs or organizations: Not on file    Relationship status: Not on file  . Intimate partner violence:    Fear of current or ex partner: Not on file    Emotionally abused: Not on file    Physically abused: Not on file    Forced sexual activity: Not on file  Other  Topics Concern  . Not on file  Social History Narrative  . Not on file    Outpatient Encounter Medications as of 09/27/2018  Medication Sig Note  . aspirin-acetaminophen-caffeine (EXCEDRIN MIGRAINE) 250-250-65 MG tablet Take 1 tablet by mouth every 6 (six) hours as needed for headache.   . Cholecalciferol (VITAMIN D) 2000 UNITS tablet Take 2,000 Units by mouth daily.  09/03/2015: Received from: [facility]  . ipratropium (ATROVENT) 0.03 % nasal spray Place 2 sprays into both nostrils every 12 (twelve) hours. (Patient taking differently: Place 2 sprays into both nostrils 2 (two) times daily. )   . levothyroxine (SYNTHROID, LEVOTHROID) 50 MCG tablet Take 1 tablet (50 mcg total) by mouth daily.   Marland Kitchen loratadine (CLARITIN) 10 MG tablet Take 1 tablet (10 mg total) by mouth daily.     . Niacin CR 250 MG TBCR Take 250 mg by mouth daily.  09/03/2015: Received from: [facility]  . Omega-3 1000 MG CAPS Take by mouth.   . [DISCONTINUED] doxycycline (VIBRA-TABS) 100 MG tablet Take 1 tablet (100 mg total) by mouth 2 (two) times daily.   . [DISCONTINUED] sulfamethoxazole-trimethoprim (BACTRIM DS,SEPTRA DS) 800-160 MG tablet Take 1 tablet by mouth every 12 (twelve) hours.    No facility-administered encounter medications on file as of 09/27/2018.     No Known Allergies  Review of Systems  Constitutional: Negative for fever and malaise/fatigue.  HENT: Negative.   Eyes: Negative.   Respiratory: Negative for cough, shortness of breath and wheezing.   Cardiovascular: Negative for chest pain, palpitations, orthopnea, claudication and leg swelling.  Gastrointestinal: Negative.   Genitourinary: Positive for flank pain (slight back pain on the right) and hematuria (treated at the beginning of the month by urology for recurrent UTI, this time had hematuria.  None now.). Negative for dysuria, frequency and urgency.  Skin: Negative.   Endo/Heme/Allergies: Negative.   Psychiatric/Behavioral: Negative.     Objective:  BP 120/70 (BP Location: Right Arm, Patient Position: Sitting, Cuff Size: Normal)   Pulse 67   Temp 97.7 F (36.5 C) (Oral)   Resp 16   Wt 204 lb (92.5 kg)   SpO2 98%   BMI 26.19 kg/m   Physical Exam  Constitutional: He is oriented to person, place, and time and well-developed, well-nourished, and in no distress.  HENT:  Head: Normocephalic and atraumatic.  Right Ear: External ear normal.  Left Ear: External ear normal.  Nose: Nose normal.  Eyes: Conjunctivae are normal. No scleral icterus.  Neck: No thyromegaly present.  Cardiovascular: Normal rate, regular rhythm, normal heart sounds and intact distal pulses.  Pulmonary/Chest: Effort normal and breath sounds normal.  Abdominal: Soft.  Musculoskeletal: He exhibits no edema.  Neurological:  He is alert and oriented to person, place, and time. Gait normal. GCS score is 15.  Skin: Skin is warm and dry.  Psychiatric: Mood, memory, affect and judgment normal.    Assessment and Plan :   1. Adult hypothyroidism   2. Hyperlipidemia, unspecified hyperlipidemia type   3. Borderline diabetes  - POCT glycosylated hemoglobin (Hb A1C)  4. Right foot pain  - DG Foot Complete Right; Future - meloxicam (MOBIC) 15 MG tablet; Take 1 tablet (15 mg total) by mouth daily as needed for pain.  Dispense: 30 tablet; Refill: 0  5. Need for influenza vaccination  - Flu vaccine HIGH DOSE PF (Fluzone High dose)   Recurrent prostatitis/UTI Urology appointment pending.  HPI, Exam and A&P Transcribed under the direction and in  the presence of Wilhemena Durie., MD. Electronically Signed: Althea Charon, RMA I have done the exam and reviewed the chart and it is accurate to the best of my knowledge. Development worker, community has been used and  any errors in dictation or transcription are unintentional. Miguel Aschoff M.D. [facility]

## 2018-09-28 NOTE — Telephone Encounter (Signed)
Can continue for 1 month

## 2018-09-28 NOTE — Telephone Encounter (Signed)
Advised  ED 

## 2018-10-04 ENCOUNTER — Ambulatory Visit
Admission: RE | Admit: 2018-10-04 | Discharge: 2018-10-04 | Disposition: A | Discharge status: Home / Self Care | Payer: Medicare Other | Source: Ambulatory Visit | Attending: Urology | Admitting: Urology

## 2018-10-04 DIAGNOSIS — R310 Gross hematuria: Secondary | ICD-10-CM | POA: Diagnosis not present

## 2018-10-04 DIAGNOSIS — N390 Urinary tract infection, site not specified: Secondary | ICD-10-CM | POA: Diagnosis not present

## 2018-10-04 DIAGNOSIS — N200 Calculus of kidney: Secondary | ICD-10-CM | POA: Diagnosis not present

## 2018-10-04 DIAGNOSIS — N419 Inflammatory disease of prostate, unspecified: Secondary | ICD-10-CM | POA: Diagnosis not present

## 2018-10-04 LAB — POCT I-STAT CREATININE: Creatinine, Ser: 1 mg/dL (ref 0.61–1.24)

## 2018-10-04 MED ORDER — IOPAMIDOL (ISOVUE-370) INJECTION 76%
100.0000 mL | Freq: Once | INTRAVENOUS | Status: AC | PRN
  Administered 2018-10-04: 100 mL via INTRAVENOUS

## 2018-10-24 ENCOUNTER — Ambulatory Visit: Payer: Medicare Other | Admitting: Urology

## 2018-10-25 ENCOUNTER — Encounter: Payer: Self-pay | Admitting: Urology

## 2018-10-25 ENCOUNTER — Ambulatory Visit (INDEPENDENT_AMBULATORY_CARE_PROVIDER_SITE_OTHER): Payer: Medicare Other | Admitting: Urology

## 2018-10-25 VITALS — BP 150/88 | HR 79 | Ht 74.0 in | Wt 198.2 lb

## 2018-10-25 DIAGNOSIS — R310 Gross hematuria: Secondary | ICD-10-CM

## 2018-10-25 DIAGNOSIS — N390 Urinary tract infection, site not specified: Secondary | ICD-10-CM

## 2018-10-25 DIAGNOSIS — R160 Hepatomegaly, not elsewhere classified: Secondary | ICD-10-CM

## 2018-10-25 NOTE — Progress Notes (Signed)
10/25/2018 3:34 PM   David Trevino [birth year] [id number]  Referring provider: Jerrol Trevino., MD [address] [place], [place] [address]  Chief Complaint  Patient presents with  . Follow-up  . Recurrent UTI    HPI: David Trevino is a 72 year old Caucasian male with a history of rUTI's and BPH with LUTS who presents today for a CTU report with his wife, David Trevino.   Background history He was referred to [place] by Dr. Miguel Trevino for acute prostatitis with his wife, David Trevino.  He presented to Dr. Alben Trevino office on 07/12/2018 with the complaints of fevers, body aches, headaches, cough and rash.  His urine culture was positive for E.Coli, WBC count was 11.9 and creatinine was 1.19.  He was started on Septra DS for ten days for which the E. coli was sensitive.  He had a recheck on 07/24/2018 and urine dip was negative.  He then presented on 08/04/2018 for a furuncle on his buttocks.  He was started on Kelfex for seven days.  He then presented on 08/17/2018 to his PCP's office with the complaint of dysuria.  His urine culture was positive for E. Coli.  He was prescribed doxycycline for which the E. coli was sensitive.  He was then referred to [place].   He denies constipation and diarrhea.  He drinks several glasses of water daily.  He drinks a diet [employer] or diet Pepsi occasionally.  No teas.  No juices.  Occasional coffee.  No alcohol.  He does not soak in the tub or use a hot tube.  He has not had a kidney stone.  He then presented to the office on 09/08/2018 with the complaints of urinary frequency, right flank pain, dysuria and gross hematuria.  His UA was positive for > 30 WBC's and > 30 RBC's.  His urine culture was positive for E.coli which was pan sensitive.  At his visit on 09/27/2018, he was  feeling well.   His UA was clear.  Patient denied any gross hematuria, dysuria or suprapubic/flank pain.  Patient denies any fevers, chills, nausea or vomiting.  He  had started to implement preventative strategies such as eating yogurt and drinking cranberry juice daily.     As patient has had three UTI's since 07/2018 and his last UTI was associated with flank pain and gross hematuria, we elected to pursue CTU for further evaluation for a nidus for his infections.    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  5 mm right kidney upper pole nonobstructive renal calculus on image 61/5. 2 mm nonobstructive renal calculus on image 57/5. 2 mm left mid kidney nonobstructive renal calculus, image 64/5.  Exophytic simple cyst from the left kidney lower pole, 2.2 by 1.9 cm. Bosniak category 1. Prostate gland measures 3.8 by 4.4 by 4.0 cm (volume = 35 cm^3). Unremarkable seminal vesicles. No significant degree of periprostatic inflammatory stranding. No appreciable prostatic abscess.  Mild prominence of stool in the remaining colon. Prior right hemicolectomy.  5. 7 mm focus of accentuated density in segment 2 of the liver. Most likely this is a small vascular malformation or potentially a hemangioma. This lesion is mainly visible on the portal venous phase images. If the patient's colectomy was due to cancer and relatively recently, or if there is concern for possibility of early metastatic disease, this could then this could be further investigated with hepatic protocol  MRI with and without contrast. Alternatively, if any outside exams can be provided we are happy to compare and issue an addendum.  Aortic Atherosclerosis  He has a history of colon cancer and has not had a previous images of the liver. He is not claustrophobic and reports of no metal or pace maker in his body.  He reports that his flank pain has disappeared.  Patient denies any gross hematuria, dysuria or suprapubic/flank pain.  Patient denies any fevers, chills, nausea or vomiting.   PMH: Past Medical History:  Diagnosis Date  . Colon cancer  ([place])   . Hyperlipidemia   . Personal history of tobacco use, presenting hazards to health 09/17/2015    Surgical History: Past Surgical History:  Procedure Laterality Date  . CATARACT EXTRACTION Bilateral    was done in 2 different months but unsure of which months.   . 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 COLON SURGERY     removal of partial colon  . HERNIA REPAIR  08/2007  . HERNIA REPAIR Right    lower right  . RETINAL DETACHMENT SURGERY Right 11/2010  . TONSILLECTOMY AND ADENOIDECTOMY  1960    Home Medications:  Allergies as of 10/25/2018   No Known Allergies     Medication List       Accurate as of October 25, 2018 11:59 PM. Always use your most recent med list.        aspirin-acetaminophen-caffeine 250-250-65 MG tablet Commonly known as:  EXCEDRIN MIGRAINE Take 1 tablet by mouth every 6 (six) hours as needed for headache.   ipratropium 0.03 % nasal spray Commonly known as:  ATROVENT Place 2 sprays into both nostrils every 12 (twelve) hours.   levothyroxine 50 MCG tablet Commonly known as:  SYNTHROID, LEVOTHROID Take 1 tablet (50 mcg total) by mouth daily.   loratadine 10 MG tablet Commonly known as:  CLARITIN Take 1 tablet (10 mg total) by mouth daily.   meloxicam 15 MG tablet Commonly known as:  MOBIC Take 1 tablet (15 mg total) by mouth daily as needed for pain.   Niacin CR 250 MG Tbcr Take 250 mg by mouth daily.   Omega-3 1000 MG Caps Take by mouth.   Vitamin D 50 MCG (2000 UT) tablet Take 2,000 Units by mouth daily.       Allergies: No Known Allergies  Family History: Family History  Problem Relation Age of Onset  . Arthritis Mother   . Diabetes Mother   . Chronic Renal Failure Mother   . Cancer Father        bladder  . Kidney disease Father   . Bladder Cancer Father   . Chronic Renal Failure Father   . Allergies Sister   . Cancer Brother         prostate    Social History:  reports that he quit smoking about 12 years ago. His smoking use included cigarettes. He has a 40.00 pack-year smoking history. He has never used smokeless tobacco. He reports that he does not drink alcohol or use drugs.  ROS: UROLOGY Frequent Urination?: No Hard to postpone urination?: No Burning/pain with urination?: No Get up at night to urinate?: No Leakage of urine?: No Urine stream starts and stops?: No Trouble starting stream?: No Do you have to strain to urinate?: No Blood in urine?: No Urinary tract infection?: No Sexually transmitted disease?: No Injury to  kidneys or bladder?: No Painful intercourse?: No Weak stream?: No Erection problems?: No Penile pain?: No  Gastrointestinal Nausea?: No Vomiting?: No Indigestion/heartburn?: No Diarrhea?: No Constipation?: No  Constitutional Fever: No Night sweats?: No Weight loss?: No Fatigue?: No  Skin Skin rash/lesions?: No Itching?: No  Eyes Blurred vision?: No Double vision?: No  Ears/Nose/Throat Sore throat?: No Sinus problems?: No  Hematologic/Lymphatic Swollen glands?: No Easy bruising?: No  Cardiovascular Leg swelling?: No Chest pain?: No  Respiratory Cough?: No Shortness of breath?: No  Endocrine Excessive thirst?: No  Musculoskeletal Back pain?: No Joint pain?: No  Neurological Headaches?: No Dizziness?: No  Psychologic Depression?: No Anxiety?: No  Physical Exam: BP (!) 150/88 (BP Location: Left Arm, Patient Position: Sitting, Cuff Size: Normal)   Pulse 79   Ht _0  (1.88 m)   Wt 198 lb 3.2 oz (89.9 kg)   BMI 25.45 kg/m   Constitutional: Well nourished. Alert and oriented, No acute distress. HEENT: [place] AT, moist mucus membranes. Trachea midline, no masses. Cardiovascular: No clubbing, cyanosis, or edema. Respiratory: Normal respiratory effort, no increased work of breathing. Skin: No rashes, bruises or suspicious lesions. Neurologic: Grossly  intact, no focal deficits, moving all 4 extremities. Psychiatric: Normal mood and affect.  Pertinent Imagings:  CLINICAL DATA:  Urinary frequency. Right flank pain. Dysuria. Gross hematuria. Urinary tract infection. Prostatitis.  EXAM: CT ABDOMEN AND PELVIS WITHOUT AND WITH CONTRAST  TECHNIQUE: Multidetector CT imaging of the abdomen and pelvis was performed following the standard protocol before and following the bolus administration of intravenous contrast.  CONTRAST:  152m ISOVUE-370 IOPAMIDOL (ISOVUE-370) INJECTION 76%  COMPARISON:  Multiple exams, including ultrasound of 08/22/2018 CT chest from 09/19/2015  FINDINGS: Lower chest: Stable lingular scarring.  Hepatobiliary: 7 mm focus of accentuated density in segment 2 of the liver on image 115/7, probably a hemangioma or small vascular malformation, but technically nonspecific due to small size. The lesion is inconspicuous on the delayed images.  The gallbladder appears unremarkable, although tiny gallstones were seen on the ultrasound from 08/22/2018.  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  5 mm right kidney upper pole nonobstructive renal calculus on image 61/5. 2 mm nonobstructive renal calculus on image 57/5. 2 mm left mid kidney nonobstructive renal calculus, image 64/5.  Exophytic simple cyst from the left kidney lower pole, 2.2 by 1.9 cm. Bosniak category 1.  Stomach/Bowel: Right hemicolectomy. Mild prominence of stool in the remaining colon.  Vascular/Lymphatic: Aortoiliac atherosclerotic vascular disease.  Reproductive: Prostate gland measures 3.8 by 4.4 by 4.0 cm (volume = 35 cm^3). Unremarkable seminal vesicles. No significant degree of periprostatic inflammatory stranding. No appreciable prostatic abscess.  Other: No supplemental non-categorized  findings.  Musculoskeletal: Hemangioma at the L3 vertebral level. Small direct bilateral inguinal hernias contain adipose tissue.  IMPRESSION: 1. No compelling findings of pyelonephritis. 2. The prostate gland is of normal volume, without findings of prostatic abscess or significant degree of periprostatic inflammatory stranding. 3. Bilateral nonobstructive nephrolithiasis. 4. Mild prominence of stool in the remaining colon. Prior right hemicolectomy. 5. 7 mm focus of accentuated density in segment 2 of the liver. Most likely this is a small vascular malformation or potentially a hemangioma. This lesion is mainly visible on the portal venous phase images. If the patient's colectomy was due to cancer and relatively recently, or if there is concern for possibility of early metastatic disease, this could then this could be further investigated with  hepatic protocol MRI with and without contrast. Alternatively, if any outside exams can be provided we are happy to compare and issue an addendum. 6.  Aortic Atherosclerosis (ICD10-I70.0).   Electronically Signed   By: Van Clines M.D.   On: 10/04/2018 15:30  I have reviewed CT scan personally and with pt and pointed out the stones and liver lesion   Assessment & Plan:    1. rUTI's Criteria for recurrent UTI has been met with 2 or more infections in 6 months or 3 or greater infections in one year  Patient to continue his preventative strategies  CTU showed bilateral nonobstructive nephrolithiasis  He is to contact us for symptoms of UTI  2. Gross hematuria CTU on 10/04/2018 revealed bilateral nephrolithiasis Explained to the patient that we need to schedule a cystoscopy to complete the hematuria workup  I have explained to the patient that they will  be scheduled for a cystoscopy in our office to evaluate their bladder.  The cystoscopy consists of passing a tube with a lens up through their urethra and into their urinary  bladder.   We will inject the urethra with a lidocaine gel prior to introducing the cystoscope to help with any discomfort during the procedure.   After the procedure, they might experience blood in the urine and discomfort with urination.  This will abate after the first few voids.  I have  encouraged the patient to increase water intake  during this time.  Patient denies any allergies to lidocaine.    3. Liver mass  CT scan from 10/04/2018 shows a 7 mm focus of accentuated density in segment 2 of the liver  Schedule for MRI of liver   Return for Cysto.  These notes generated with voice recognition software. I apologize for typographical errors.  Zara Council, PA-C  [facility] [address]  [place], [place] [address] [phone]  I, Lucas Mallow, am acting as a Education administrator for [employer],  I have reviewed the above documentation for accuracy and completeness, and I agree with the above.    Zara Council, PA-C

## 2018-11-16 ENCOUNTER — Encounter: Payer: Self-pay | Admitting: Urology

## 2018-11-16 ENCOUNTER — Ambulatory Visit (INDEPENDENT_AMBULATORY_CARE_PROVIDER_SITE_OTHER): Payer: Medicare Other | Admitting: Urology

## 2018-11-16 VITALS — BP 132/76 | HR 64 | Ht 74.0 in | Wt 200.0 lb

## 2018-11-16 DIAGNOSIS — R310 Gross hematuria: Secondary | ICD-10-CM | POA: Diagnosis not present

## 2018-11-16 DIAGNOSIS — N400 Enlarged prostate without lower urinary tract symptoms: Secondary | ICD-10-CM

## 2018-11-16 LAB — MICROSCOPIC EXAMINATION
Bacteria, UA: NONE SEEN
EPITHELIAL CELLS (NON RENAL): NONE SEEN /HPF (ref 0–10)
RBC MICROSCOPIC, UA: NONE SEEN /HPF (ref 0–2)
WBC UA: NONE SEEN /HPF (ref 0–5)

## 2018-11-16 LAB — BLADDER SCAN AMB NON-IMAGING

## 2018-11-16 LAB — URINALYSIS, COMPLETE
BILIRUBIN UA: NEGATIVE
Glucose, UA: NEGATIVE
KETONES UA: NEGATIVE
Leukocytes, UA: NEGATIVE
NITRITE UA: NEGATIVE
PH UA: 7 (ref 5.0–7.5)
Protein, UA: NEGATIVE
RBC UA: NEGATIVE
SPEC GRAV UA: 1.015 (ref 1.005–1.030)
Urobilinogen, Ur: 0.2 mg/dL (ref 0.2–1.0)

## 2018-11-16 NOTE — Progress Notes (Signed)
Cystoscopy Procedure Note:  Indication: Recurrent UTI x3  After informed consent and discussion of the procedure and its risks, David Trevino was positioned and prepped in the standard fashion. Cystoscopy was performed with a flexible cystoscope. The urethra, bladder neck and entire bladder was visualized in a standard fashion. The prostate was small with lateral lobe hyperplasia. The ureteral orifices were visualized in their normal location and orientation. No tumors or lesions, but bladder was moderately trabeculated and distended.  Imaging: CTU dated 11/27 reviewed, punctate renal stone bilaterally, no hydronephrosis, no renal mass, no filling defects. Thickened bladder, prostate measures 36cc.  Findings: Trabeculated bladder, otherwise normal cystoscopy  Assessment and Plan: Patient is a 73 year old male with 3 urinary tract infections over the last 3 months.  He has no urinary symptoms and denies any other complaints.  He has started taking cranberry prophylaxis twice daily.  His PVR in clinic today was 30 cc after cystoscopy.  I suspect his UTIs may have been caused by incomplete emptying or high pressure voiding based on his thickened bladder on CT with moderate trabeculations.  He would like to avoid any medications at this time RTC in 4 weeks with PVR and IPSS If recurrent UTIs in the future, may benefit from TURP(36 cc gland)  David Madrid, MD 11/16/2018

## 2018-11-17 ENCOUNTER — Telehealth: Payer: Self-pay

## 2018-11-17 ENCOUNTER — Ambulatory Visit
Admission: RE | Admit: 2018-11-17 | Discharge: 2018-11-17 | Disposition: A | Discharge status: Home / Self Care | Payer: Medicare Other | Source: Ambulatory Visit | Attending: Urology | Admitting: Urology

## 2018-11-17 DIAGNOSIS — R160 Hepatomegaly, not elsewhere classified: Secondary | ICD-10-CM | POA: Diagnosis not present

## 2018-11-17 DIAGNOSIS — D1803 Hemangioma of intra-abdominal structures: Secondary | ICD-10-CM | POA: Diagnosis not present

## 2018-11-17 LAB — POCT I-STAT CREATININE: CREATININE: 1 mg/dL (ref 0.61–1.24)

## 2018-11-17 MED ORDER — GADOBUTROL 1 MMOL/ML IV SOLN
9.0000 mL | Freq: Once | INTRAVENOUS | Status: AC | PRN
  Administered 2018-11-17: 9 mL via INTRAVENOUS

## 2018-11-17 NOTE — Telephone Encounter (Signed)
Informed patient of MRI results.

## 2018-11-17 NOTE — Telephone Encounter (Signed)
-----   Message from Nori Riis, PA-C sent at 11/17/2018 10:36 AM EST ----- Please let Mr. Kretschmer know that his MRI of his liver was negative.  They are just benign cysts.  We will see him in February with Dr. Diamantina Providence.

## 2018-12-14 ENCOUNTER — Ambulatory Visit (INDEPENDENT_AMBULATORY_CARE_PROVIDER_SITE_OTHER): Payer: Medicare Other | Admitting: Urology

## 2018-12-14 ENCOUNTER — Encounter: Payer: Self-pay | Admitting: Urology

## 2018-12-14 VITALS — BP 155/95 | HR 83 | Resp 14 | Ht 74.0 in

## 2018-12-14 DIAGNOSIS — N400 Enlarged prostate without lower urinary tract symptoms: Secondary | ICD-10-CM

## 2018-12-14 LAB — BLADDER SCAN AMB NON-IMAGING: SCAN RESULT: 63

## 2018-12-14 NOTE — Progress Notes (Signed)
   12/14/2018 3:15 PM   Danae Orleans 1946/03/30 [id number]  Reason for visit: Follow up recurrent UTIs  HPI: I saw Mr. Brumett back in urology clinic to discuss his recurrent UTIs.  He is a healthy 73 year old male who was presented with three E. coli UTIs over the last 4 months.  He underwent work-up with CT urogram and cystoscopy.  CT urogram showed punctate renal stones bilaterally, no hydronephrosis, and thickened bladder with a prostate volume of 36 cc.  Clinic cystoscopy showed a trabeculated bladder but no other lesions.  He was managed with cranberry prophylaxis and behavioral strategies including adequate hydration and timed voiding.  Since we last saw him he denies any complaints.  He is voiding with a strong stream and denies any hematuria.  His PVR today in clinic is 63 cc.  He has not had any dysuria or urinary tract infections since November 2019.  We discussed options for his recurrent urinary tract infections including observation versus considering more aggressive treatment with TURP.  He would like to continue observation as he is currently doing well.  He will continue to supplement with cranberry prophylaxis.  We discussed the importance of timed voiding and adequate hydration.  RTC 1 year with PVR, sooner if problems  A total of 10 minutes were spent face-to-face with the patient, greater than 50% was spent in patient education, counseling, and coordination of care regarding recurrent urinary tract infections, BPH, observation, and TURP.  Billey Co, [facility] [address], [place], [place] [address] [phone]

## 2018-12-14 NOTE — Patient Instructions (Signed)
Urinary Tract Infection, Adult A urinary tract infection (UTI) is an infection of any part of the urinary tract. The urinary tract includes:  The kidneys.  The ureters.  The bladder.  The urethra. These organs make, store, and get rid of pee (urine) in the body. What are the causes? This is caused by germs (bacteria) in your genital area. These germs grow and cause swelling (inflammation) of your urinary tract. What increases the risk? You are more likely to develop this condition if:  You have a small, thin tube (catheter) to drain pee.  You cannot control when you pee or poop (incontinence).  You are male, and: ? You use these methods to prevent pregnancy: ? A medicine that kills sperm (spermicide). ? A device that blocks sperm (diaphragm). ? You have low levels of a male hormone (estrogen). ? You are pregnant.  You have genes that add to your risk.  You are sexually active.  You take antibiotic medicines.  You have trouble peeing because of: ? A prostate that is bigger than normal, if you are male. ? A blockage in the part of your body that drains pee from the bladder (urethra). ? A kidney stone. ? A nerve condition that affects your bladder (neurogenic bladder). ? Not getting enough to drink. ? Not peeing often enough.  You have other conditions, such as: ? Diabetes. ? A weak disease-fighting system (immune system). ? Sickle cell disease. ? Gout. ? Injury of the spine. What are the signs or symptoms? Symptoms of this condition include:  Needing to pee right away (urgently).  Peeing often.  Peeing small amounts often.  Pain or burning when peeing.  Blood in the pee.  Pee that smells bad or not like normal.  Trouble peeing.  Pee that is cloudy.  Fluid coming from the vagina, if you are male.  Pain in the belly or lower back. Other symptoms include:  Throwing up (vomiting).  No urge to eat.  Feeling mixed up (confused).  Being tired  and grouchy (irritable).  A fever.  Watery poop (diarrhea). How is this treated? This condition may be treated with:  Antibiotic medicine.  Other medicines.  Drinking enough water. Follow these instructions at home:  Medicines  Take over-the-counter and prescription medicines only as told by your doctor.  If you were prescribed an antibiotic medicine, take it as told by your doctor. Do not stop taking it even if you start to feel better. General instructions  Make sure you: ? Pee until your bladder is empty. ? Do not hold pee for a long time. ? Empty your bladder after sex. ? Wipe from front to back after pooping if you are a male. Use each tissue one time when you wipe.  Drink enough fluid to keep your pee pale yellow.  Keep all follow-up visits as told by your doctor. This is important. Contact a doctor if:  You do not get better after 1-2 days.  Your symptoms go away and then come back. Get help right away if:  You have very bad back pain.  You have very bad pain in your lower belly.  You have a fever.  You are sick to your stomach (nauseous).  You are throwing up. Summary  A urinary tract infection (UTI) is an infection of any part of the urinary tract.  This condition is caused by germs in your genital area.  There are many risk factors for a UTI. These include having a small, thin   tube to drain pee and not being able to control when you pee or poop.  Treatment includes antibiotic medicines for germs.  Drink enough fluid to keep your pee pale yellow. This information is not intended to replace advice given to you by your health care provider. Make sure you discuss any questions you have with your health care provider. Document Released: 04/12/2008 Document Revised: 05/04/2018 Document Reviewed: 05/04/2018 Elsevier Interactive Patient Education  2019 [employer]  

## 2019-02-14 ENCOUNTER — Telehealth: Payer: Self-pay | Admitting: Urology

## 2019-03-01 NOTE — Telephone Encounter (Signed)
error 

## 2019-04-03 ENCOUNTER — Ambulatory Visit: Payer: No Typology Code available for payment source

## 2019-04-11 ENCOUNTER — Ambulatory Visit (INDEPENDENT_AMBULATORY_CARE_PROVIDER_SITE_OTHER): Payer: Medicare Other | Admitting: Family Medicine

## 2019-04-11 ENCOUNTER — Other Ambulatory Visit: Payer: Self-pay

## 2019-04-11 ENCOUNTER — Ambulatory Visit (INDEPENDENT_AMBULATORY_CARE_PROVIDER_SITE_OTHER): Payer: Medicare Other

## 2019-04-11 VITALS — BP 124/64 | HR 75 | Temp 98.6°F | Wt 204.0 lb

## 2019-04-11 VITALS — BP 124/64 | HR 75 | Temp 98.6°F | Ht 74.0 in | Wt 204.2 lb

## 2019-04-11 DIAGNOSIS — R0982 Postnasal drip: Secondary | ICD-10-CM | POA: Diagnosis not present

## 2019-04-11 DIAGNOSIS — C189 Malignant neoplasm of colon, unspecified: Secondary | ICD-10-CM

## 2019-04-11 DIAGNOSIS — E039 Hypothyroidism, unspecified: Secondary | ICD-10-CM | POA: Diagnosis not present

## 2019-04-11 DIAGNOSIS — E785 Hyperlipidemia, unspecified: Secondary | ICD-10-CM

## 2019-04-11 DIAGNOSIS — R7303 Prediabetes: Secondary | ICD-10-CM

## 2019-04-11 DIAGNOSIS — Z0000 Encounter for general adult medical examination without abnormal findings: Secondary | ICD-10-CM

## 2019-04-11 MED ORDER — LORATADINE 10 MG PO TABS: 10.0000 mg | ORAL_TABLET | Freq: Every day | ORAL | 3 refills | Status: DC

## 2019-04-11 MED ORDER — LEVOTHYROXINE SODIUM 50 MCG PO TABS: 50.0000 ug | ORAL_TABLET | Freq: Every day | ORAL | 3 refills | Status: DC

## 2019-04-11 NOTE — Progress Notes (Signed)
Subjective:   David Trevino is a 73 y.o. male who presents for Medicare Annual/Subsequent preventive examination.  Review of Systems:  N/A  Cardiac Risk Factors include: advanced age (>70men, >78 women);male gender     Objective:    Vitals: BP 124/64 (BP Location: Right Arm)   Pulse 75   Temp 98.6 F (37 C) (Oral)   Ht 6\' 2"  (1.88 m)   Wt 204 lb 3.2 oz (92.6 kg)   BMI 26.22 kg/m   Body mass index is 26.22 kg/m.  Advanced Directives 04/11/2019 03/28/2018 07/16/2017 09/21/2016  Does Patient Have a Medical Advance Directive? No No No No  Would patient like information on creating a medical advance directive? No - Patient declined Yes (MAU/Ambulatory/Procedural Areas - Information given) No - Patient declined -    Tobacco Social History   Tobacco Use  Smoking Status Former Smoker  . Packs/day: 1.00  . Years: 40.00  . Pack years: 40.00  . Types: Cigarettes  . Last attempt to quit: 11/21/2005  . Years since quitting: 13.3  Smokeless Tobacco Never Used     Counseling given: Not Answered   Clinical Intake:  Pre-visit preparation completed: Yes  Pain : No/denies pain Pain Score: 0-No pain     Nutritional Status: BMI 25 -29 Overweight Nutritional Risks: None Diabetes: No  How often do you need to have someone help you when you read instructions, pamphlets, or other written materials from your doctor or pharmacy?: 1 - Never  Interpreter Needed?: No  Information entered by :: [facility], LPN  Past Medical History:  Diagnosis Date  . Colon cancer ([place])   . Hyperlipidemia   . Personal history of tobacco use, presenting hazards to Trevino 09/17/2015   Past Surgical History:  Procedure Laterality Date  . CATARACT EXTRACTION Bilateral    was done in 2 different months but unsure of which months.   . 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 COLON SURGERY     removal of partial colon   . HERNIA REPAIR  08/2007  . HERNIA REPAIR Right    lower right  . RETINAL DETACHMENT SURGERY Right 11/2010  . TONSILLECTOMY AND ADENOIDECTOMY  1960   Family History  Problem Relation Age of Onset  . Arthritis Mother   . Diabetes Mother   . Chronic Renal Failure Mother   . Cancer Father        bladder  . Kidney disease Father   . Bladder Cancer Father   . Chronic Renal Failure Father   . Allergies Sister   . Cancer Brother        prostate   Social History   Socioeconomic History  . Marital status: Married    Spouse name: Not on file  . Number of children: 2  . Years of education: Not on file  . Highest education level: Some college, no degree  Occupational History  . Occupation: retired  Scientific laboratory technician  . Financial resource strain: Not hard at all  . Food insecurity:    Worry: Never true    Inability: Never true  . Transportation needs:    Medical: No    Non-medical: No  Tobacco Use  . Smoking status: Former Smoker    Packs/day: 1.00    Years: 40.00    Pack years: 40.00    Types: Cigarettes    Last attempt to quit: 11/21/2005  Years since quitting: 13.3  . Smokeless tobacco: Never Used  Substance and Sexual Activity  . Alcohol use: No    Comment: Quit drinking alcohol 10/16/2005  . Drug use: No  . Sexual activity: Not on file  Lifestyle  . Physical activity:    Days per week: 0 days    Minutes per session: 0 min  . Stress: Not at all  Relationships  . Social connections:    Talks on phone: Patient refused    Gets together: Patient refused    Attends religious service: Patient refused    Active member of club or organization: Patient refused    Attends meetings of clubs or organizations: Patient refused    Relationship status: Patient refused  Other Topics Concern  . Not on file  Social History Narrative  . Not on file    Outpatient Encounter Medications as of 04/11/2019  Medication Sig  . aspirin-acetaminophen-caffeine (EXCEDRIN MIGRAINE)  250-250-65 MG tablet Take 1 tablet by mouth every 6 (six) hours as needed for headache.  . Cholecalciferol (VITAMIN D) 2000 UNITS tablet Take 2,000 Units by mouth daily.   Marland Kitchen ipratropium (ATROVENT) 0.03 % nasal spray Place 2 sprays into both nostrils every 12 (twelve) hours. (Patient taking differently: Place 2 sprays into both nostrils 2 (two) times daily. )  . levothyroxine (SYNTHROID, LEVOTHROID) 50 MCG tablet Take 1 tablet (50 mcg total) by mouth daily.  Marland Kitchen loratadine (CLARITIN) 10 MG tablet Take 1 tablet (10 mg total) by mouth daily.  . Niacin CR 250 MG TBCR Take 250 mg by mouth daily.   . Omega-3 1000 MG CAPS Take by mouth daily.    No facility-administered encounter medications on file as of 04/11/2019.     Activities of Daily Living In your present state of health, do you have any difficulty performing the following activities: 04/11/2019  Hearing? N  Vision? N  Difficulty concentrating or making decisions? N  Walking or climbing stairs? Y  Comment Due to SOB related to seasonal allergies and wearing masks.   Dressing or bathing? N  Doing errands, shopping? N  Preparing Food and eating ? N  Using the Toilet? N  In the past six months, have you accidently leaked urine? N  Do you have problems with loss of bowel control? N  Managing your Medications? N  Managing your Finances? N  Housekeeping or managing your Housekeeping? N  Some recent data might be hidden    Patient Care Team: [name], MD as PCP - General (Family Medicine) Thelma Comp, MD as Referring Physician (Gastroenterology) Billey Co, MD as Consulting Physician (Urology)   Assessment:   This is a routine wellness examination for David Trevino.  Exercise Activities and Dietary recommendations Current Exercise Habits: The patient does not participate in regular exercise at present, Exercise limited by: None identified  Goals    . DIET - REDUCE SUGAR INTAKE     Recommend cutting back on sugar and  sweets to a couple times a week.     Marland Kitchen LIFESTYLE - DECREASE FALLS RISK     Recommend to remove any items from the home that may cause slips or trips.        Fall Risk: Fall Risk  04/11/2019 03/28/2018 10/03/2017 09/21/2016 09/11/2015  Falls in the past year? 1 Yes Yes Yes No  Number falls in past yr: 1 1 1 1  -  Comment - stumble to a fall - - -  Injury with Fall? 0  No Yes No -  Follow up Falls prevention discussed Falls prevention discussed - - -    FALL RISK PREVENTION PERTAINING TO THE HOME:  Any stairs in or around the home? Yes  If so, are there any without handrails? No  Home free of loose throw rugs in walkways, pet beds, electrical cords, etc? Yes  Adequate lighting in your home to reduce risk of falls? Yes   ASSISTIVE DEVICES UTILIZED TO PREVENT FALLS:  Life alert? No  Use of a cane, walker or w/c? No  Grab bars in the bathroom? No  Shower chair or bench in shower? No  Elevated toilet seat or a handicapped toilet? Yes   TIMED UP AND GO:  Was the test performed? No .    Depression Screen PHQ 2/9 Scores 04/11/2019 03/28/2018 10/03/2017 09/21/2016  PHQ - 2 Score 0 0 0 0    Cognitive Function     6CIT Screen 03/28/2018 09/21/2016  What Year? 0 points 0 points  What month? 0 points 0 points  What time? 0 points 0 points  Count back from 20 0 points 0 points  Months in reverse 0 points 0 points  Repeat phrase 0 points 0 points  Total Score 0 0    Immunization History  Administered Date(s) Administered  . Influenza, High Dose Seasonal PF 09/27/2018  . Influenza,inj,Quad PF,6+ Mos 08/06/2013  . Pneumococcal Conjugate-13 09/11/2015  . Pneumococcal Polysaccharide-23 07/17/2012  . Tdap 05/02/2007    Qualifies for Shingles Vaccine? Yes . Due for Shingrix. Education has been provided regarding the importance of this vaccine. Pt has been advised to call insurance company to determine out of pocket expense. Advised may also receive vaccine at local pharmacy or  [facility]. Verbalized acceptance and understanding.  Tdap: Although this vaccine is not a covered service during a Wellness Exam, does the patient still wish to receive this vaccine today?  No .  Education has been provided regarding the importance of this vaccine. Advised may receive this vaccine at local pharmacy or [facility] Dept. Aware to provide a copy of the vaccination record if obtained from local pharmacy or [facility]. Verbalized acceptance and understanding.  Flu Vaccine: Up to date  Pneumococcal Vaccine: Up to date  Screening Tests [facility] Maintenance  Topic Date Due  . TETANUS/TDAP  05/01/2017  . COLONOSCOPY  02/19/2019  . INFLUENZA VACCINE  06/09/2019  . Hepatitis C Screening  Completed  . PNA vac Low Risk Adult  Completed   Cancer Screenings:  Colorectal Screening: Completed 02/18/14. Repeat every 5 years. Pt plans to schedule colonoscopy at the end of this year.   Lung Cancer Screening: (Low Dose CT Chest recommended if Age 64-80 years, 30 pack-year currently smoking OR have quit w/in 15years.) does qualify, however has this completed 07/2018.  Additional Screening:  Hepatitis C Screening: Up to date  Vision Screening: Recommended annual ophthalmology exams for early detection of glaucoma and other disorders of the eye.  Dental Screening: Recommended annual dental exams for proper oral hygiene  Community Resource Referral:  CRR required this visit?  No        Plan:  I have personally reviewed and addressed the Medicare Annual Wellness questionnaire and have noted the following in the patient's chart:  A. Medical and social history B. Use of alcohol, tobacco or illicit drugs  C. Current medications and supplements D. Functional ability and status E.  Nutritional status F.  Physical activity G. Advance directives H. List of other physicians I.  Hospitalizations, surgeries, and ER visits in previous 12 months J.  [place] such as hearing and  vision if needed, cognitive and depression L. Referrals and appointments   In addition, I have reviewed and discussed with patient certain preventive protocols, quality metrics, and best practice recommendations. A written personalized care plan for preventive services as well as general preventive [facility] recommendations were provided to patient.   Glendora Score, [place]  11/16/8020 Nurse [facility] Advisor   Nurse Notes: Declined tetanus vaccine today. Pt to schedule colonoscopy at the end of the year.

## 2019-04-11 NOTE — Patient Instructions (Addendum)
Mr. Hanak , Thank you for taking time to come for your Medicare Wellness Visit. I appreciate your ongoing commitment to your health goals. Please review the following plan we discussed and let me know if I can assist you in the future.   Screening recommendations/referrals: Colonoscopy: Currently due, pt to schedule colonoscopy this year.  Recommended yearly ophthalmology/optometry visit for glaucoma screening and checkup Recommended yearly dental visit for hygiene and checkup  Vaccinations: Influenza vaccine: Up to date Pneumococcal vaccine: Completed series Tdap vaccine: Pt declines today.  Shingles vaccine: Pt declines today.     Advanced directives: Advance directive discussed with you today. Even though you declined this today please call our office should you change your mind and we can give you the proper paperwork for you to fill out.  Conditions/risks identified: Fall risk prevention discussed with you today. Continue to monitor sugar consumption and decrease sweets in daily diet.   Next appointment: 2:00 PM today with Dr Rosanna Randy.   Preventive Care 9 Years and Older, Male Preventive care refers to lifestyle choices and visits with your health care provider that can promote health and wellness. What does preventive care include?  A yearly physical exam. This is also called an annual well check.  Dental exams once or twice a year.  Routine eye exams. Ask your health care provider how often you should have your eyes checked.  Personal lifestyle choices, including:  Daily care of your teeth and gums.  Regular physical activity.  Eating a healthy diet.  Avoiding tobacco and drug use.  Limiting alcohol use.  Practicing safe sex.  Taking low doses of aspirin every day.  Taking vitamin and mineral supplements as recommended by your health care provider. What happens during an annual well check? The services and screenings done by your health care provider during  your annual well check will depend on your age, overall health, lifestyle risk factors, and family history of disease. Counseling  Your health care provider may ask you questions about your:  Alcohol use.  Tobacco use.  Drug use.  Emotional well-being.  Home and relationship well-being.  Sexual activity.  Eating habits.  History of falls.  Memory and ability to understand (cognition).  Work and work Statistician. Screening  You may have the following tests or measurements:  Height, weight, and BMI.  Blood pressure.  Lipid and cholesterol levels. These may be checked every 5 years, or more frequently if you are over 25 years old.  Skin check.  Lung cancer screening. You may have this screening every year starting at age 51 if you have a 30-pack-year history of smoking and currently smoke or have quit within the past 15 years.  Fecal occult blood test (FOBT) of the stool. You may have this test every year starting at age 2.  Flexible sigmoidoscopy or colonoscopy. You may have a sigmoidoscopy every 5 years or a colonoscopy every 10 years starting at age 28.  Prostate cancer screening. Recommendations will vary depending on your family history and other risks.  Hepatitis C blood test.  Hepatitis B blood test.  Sexually transmitted disease (STD) testing.  Diabetes screening. This is done by checking your blood sugar (glucose) after you have not eaten for a while (fasting). You may have this done every 1-3 years.  Abdominal aortic aneurysm (AAA) screening. You may need this if you are a current or former smoker.  Osteoporosis. You may be screened starting at age 80 if you are at high risk. Talk with  your health care provider about your test results, treatment options, and if necessary, the need for more tests. Vaccines  Your health care provider may recommend certain vaccines, such as:  Influenza vaccine. This is recommended every year.  Tetanus, diphtheria, and  acellular pertussis (Tdap, Td) vaccine. You may need a Td booster every 10 years.  Zoster vaccine. You may need this after age 52.  Pneumococcal 13-valent conjugate (PCV13) vaccine. One dose is recommended after age 51.  Pneumococcal polysaccharide (PPSV23) vaccine. One dose is recommended after age 7. Talk to your health care provider about which screenings and vaccines you need and how often you need them. This information is not intended to replace advice given to you by your health care provider. Make sure you discuss any questions you have with your health care provider. Document Released: 11/21/2015 Document Revised: 07/14/2016 Document Reviewed: 08/26/2015 Elsevier Interactive Patient Education  2017 [place] Prevention in the Home Falls can cause injuries. They can happen to people of all ages. There are many things you can do to make your home safe and to help prevent falls. What can I do on the outside of my home?  Regularly fix the edges of walkways and driveways and fix any cracks.  Remove anything that might make you trip as you walk through a door, such as a raised step or threshold.  Trim any bushes or trees on the path to your home.  Use bright outdoor lighting.  Clear any walking paths of anything that might make someone trip, such as rocks or tools.  Regularly check to see if handrails are loose or broken. Make sure that both sides of any steps have handrails.  Any raised decks and porches should have guardrails on the edges.  Have any leaves, snow, or ice cleared regularly.  Use sand or salt on walking paths during winter.  Clean up any spills in your garage right away. This includes oil or grease spills. What can I do in the bathroom?  Use night lights.  Install grab bars by the toilet and in the tub and shower. Do not use towel bars as grab bars.  Use non-skid mats or decals in the tub or shower.  If you need to sit down in the shower, use  a plastic, non-slip stool.  Keep the floor dry. Clean up any water that spills on the floor as soon as it happens.  Remove soap buildup in the tub or shower regularly.  Attach bath mats securely with double-sided non-slip rug tape.  Do not have throw rugs and other things on the floor that can make you trip. What can I do in the bedroom?  Use night lights.  Make sure that you have a light by your bed that is easy to reach.  Do not use any sheets or blankets that are too big for your bed. They should not hang down onto the floor.  Have a firm chair that has side arms. You can use this for support while you get dressed.  Do not have throw rugs and other things on the floor that can make you trip. What can I do in the kitchen?  Clean up any spills right away.  Avoid walking on wet floors.  Keep items that you use a lot in easy-to-reach places.  If you need to reach something above you, use a strong step stool that has a grab bar.  Keep electrical cords out of the way.  Do not  use floor polish or wax that makes floors slippery. If you must use wax, use non-skid floor wax.  Do not have throw rugs and other things on the floor that can make you trip. What can I do with my stairs?  Do not leave any items on the stairs.  Make sure that there are handrails on both sides of the stairs and use them. Fix handrails that are broken or loose. Make sure that handrails are as long as the stairways.  Check any carpeting to make sure that it is firmly attached to the stairs. Fix any carpet that is loose or worn.  Avoid having throw rugs at the top or bottom of the stairs. If you do have throw rugs, attach them to the floor with carpet tape.  Make sure that you have a light switch at the top of the stairs and the bottom of the stairs. If you do not have them, ask someone to add them for you. What else can I do to help prevent falls?  Wear shoes that:  Do not have high heels.  Have  rubber bottoms.  Are comfortable and fit you well.  Are closed at the toe. Do not wear sandals.  If you use a stepladder:  Make sure that it is fully opened. Do not climb a closed stepladder.  Make sure that both sides of the stepladder are locked into place.  Ask someone to hold it for you, if possible.  Clearly mark and make sure that you can see:  Any grab bars or handrails.  First and last steps.  Where the edge of each step is.  Use tools that help you move around (mobility aids) if they are needed. These include:  Canes.  Walkers.  Scooters.  Crutches.  Turn on the lights when you go into a dark area. Replace any light bulbs as soon as they burn out.  Set up your furniture so you have a clear path. Avoid moving your furniture around.  If any of your floors are uneven, fix them.  If there are any pets around you, be aware of where they are.  Review your medicines with your doctor. Some medicines can make you feel dizzy. This can increase your chance of falling. Ask your doctor what other things that you can do to help prevent falls. This information is not intended to replace advice given to you by your health care provider. Make sure you discuss any questions you have with your health care provider. Document Released: 08/21/2009 Document Revised: 04/01/2016 Document Reviewed: 11/29/2014 Elsevier Interactive Patient Education  2017 [employer].

## 2019-04-11 NOTE — Progress Notes (Signed)
Patient: David Trevino Male    DOB: 1945-12-20   73 y.o.   MRN: [id number] Visit Date: 04/11/2019  Today's Provider: Wilhemena Durie, MD   Chief Complaint  Patient presents with  . Hypothyroidism  . Hyperlipidemia  . Hyperglycemia   Subjective:   Patient had a AWV with Mckenzie today.   HPI The patient is a 73 year old male who presents for follow up of chronic health.  Hypothyroidism-patient has been stable on his current dose of Levothyroxine 50 mcg daily. Lab Results  Component Value Date   TSH 1.390 04/04/2018   Hyperlipidemia-the patient had his lipid levels checked on 04/04/18 and they were slightly worse than the previous 6 months.  He is currently on Niacin and Omega 3 only. Lab Results  Component Value Date   CHOL 203 (H) 04/04/2018   HDL 42 04/04/2018   LDLCALC 115 (H) 04/04/2018   TRIG 229 (H) 04/04/2018   Borderline diabetes-Patient has been checking his glucose at home on occasion.  He gets readings that range 90-110's. Lab Results  Component Value Date   HGBA1C 5.7 (A) 09/27/2018    No Known Allergies   Current Outpatient Medications:  .  aspirin-acetaminophen-caffeine (EXCEDRIN MIGRAINE) 250-250-65 MG tablet, Take 1 tablet by mouth every 6 (six) hours as needed for headache., Disp: , Rfl:  .  Cholecalciferol (VITAMIN D) 2000 UNITS tablet, Take 2,000 Units by mouth daily. , Disp: , Rfl:  .  ipratropium (ATROVENT) 0.03 % nasal spray, Place 2 sprays into both nostrils every 12 (twelve) hours. (Patient taking differently: Place 2 sprays into both nostrils 2 (two) times daily. ), Disp: 30 mL, Rfl: 12 .  levothyroxine (SYNTHROID, LEVOTHROID) 50 MCG tablet, Take 1 tablet (50 mcg total) by mouth daily., Disp: 90 tablet, Rfl: 3 .  loratadine (CLARITIN) 10 MG tablet, Take 1 tablet (10 mg total) by mouth daily., Disp: 90 tablet, Rfl: 3 .  Niacin CR 250 MG TBCR, Take 250 mg by mouth daily. , Disp: , Rfl:  .  Omega-3 1000 MG CAPS, Take by mouth  daily. , Disp: , Rfl:   Review of Systems  Constitutional: Negative.   HENT: Negative.   Eyes: Negative.   Respiratory: Negative.   Cardiovascular: Negative.   Gastrointestinal: Negative.   Endocrine: Negative.   Genitourinary: Negative.   Musculoskeletal: Negative.   Skin: Negative.   Allergic/Immunologic: Negative.   Neurological: Negative.   Hematological: Negative.   Psychiatric/Behavioral: Negative.     Social History   Tobacco Use  . Smoking status: Former Smoker    Packs/day: 1.00    Years: 40.00    Pack years: 40.00    Types: Cigarettes    Last attempt to quit: 11/21/2005    Years since quitting: 13.3  . Smokeless tobacco: Never Used  Substance Use Topics  . Alcohol use: No    Comment: Quit drinking alcohol 10/16/2005      Objective:   BP 124/64   Pulse 75   Temp 98.6 F (37 C)   Wt 204 lb (92.5 kg)   BMI 26.19 kg/m  Vitals:   04/11/19 1355  BP: 124/64  Pulse: 75  Temp: 98.6 F (37 C)  Weight: 204 lb (92.5 kg)     Physical Exam Vitals signs reviewed.  Constitutional:      Appearance: He is well-developed.  HENT:     Head: Normocephalic and atraumatic.     Right Ear: Tympanic membrane, ear canal and  external ear normal.     Left Ear: Tympanic membrane, ear canal and external ear normal.     Ears:     Comments: Right EAC blocked with cerumen.    Nose: Nose normal.  Eyes:     General: No scleral icterus.    Conjunctiva/sclera: Conjunctivae normal.  Neck:     Thyroid: No thyromegaly.  Cardiovascular:     Rate and Rhythm: Normal rate and regular rhythm.     Heart sounds: Normal heart sounds.  Pulmonary:     Effort: Pulmonary effort is normal.     Breath sounds: Normal breath sounds.  Abdominal:     Palpations: Abdomen is soft.  Musculoskeletal:     Right lower leg: No edema.     Left lower leg: No edema.  Skin:    General: Skin is warm and dry.  Neurological:     General: No focal deficit present.     Mental Status: He is alert  and oriented to person, place, and time.  Psychiatric:        Mood and Affect: Mood normal.        Behavior: Behavior normal.        Thought Content: Thought content normal.        Judgment: Judgment normal.         Assessment & Plan    1. Adult hypothyroidism  - TSH - levothyroxine (SYNTHROID) 50 MCG tablet; Take 1 tablet (50 mcg total) by mouth daily.  Dispense: 90 tablet; Refill: 3  2. Hyperlipidemia, unspecified hyperlipidemia type  - Comprehensive metabolic panel  3. Borderline diabetes  - CBC with Differential/Platelet - Hemoglobin A1c  4. Post-nasal drip  - loratadine (CLARITIN) 10 MG tablet; Take 1 tablet (10 mg total) by mouth daily.  Dispense: 90 tablet; Refill: 3 5.h/o Colon cancer   I have done the exam and reviewed the above chart and it is accurate to the best of my knowledge. Development worker, community has been used in this note in any air is in the dictation or transcription are unintentional.  Wilhemena Durie, MD  [place]

## 2019-04-11 NOTE — Patient Instructions (Signed)
Stop Niacin

## 2019-04-12 ENCOUNTER — Telehealth: Payer: Self-pay

## 2019-04-12 LAB — COMPREHENSIVE METABOLIC PANEL
ALT: 17 IU/L (ref 0–44)
AST: 17 IU/L (ref 0–40)
Albumin/Globulin Ratio: 1.6 (ref 1.2–2.2)
Albumin: 4.6 g/dL (ref 3.7–4.7)
Alkaline Phosphatase: 75 IU/L (ref 39–117)
BUN/Creatinine Ratio: 16 (ref 10–24)
BUN: 16 mg/dL (ref 8–27)
Bilirubin Total: 0.4 mg/dL (ref 0.0–1.2)
CO2: 19 mmol/L — ABNORMAL LOW (ref 20–29)
Calcium: 9.1 mg/dL (ref 8.6–10.2)
Chloride: 104 mmol/L (ref 96–106)
Creatinine, Ser: 1.02 mg/dL (ref 0.76–1.27)
GFR calc Af Amer: 85 mL/min/{1.73_m2} (ref >59)
GFR calc non Af Amer: 74 mL/min/{1.73_m2} (ref >59)
Globulin, Total: 2.8 g/dL (ref 1.5–4.5)
Glucose: 87 mg/dL (ref 65–99)
Potassium: 4.4 mmol/L (ref 3.5–5.2)
Sodium: 139 mmol/L (ref 134–144)
Total Protein: 7.4 g/dL (ref 6.0–8.5)

## 2019-04-12 LAB — CBC WITH DIFFERENTIAL/PLATELET
Basophils Absolute: 0.1 10*3/uL (ref 0.0–0.2)
Basos: 1 %
EOS (ABSOLUTE): 0.1 10*3/uL (ref 0.0–0.4)
Eos: 1 %
Hematocrit: 44.2 % (ref 37.5–51.0)
Hemoglobin: 15.5 g/dL (ref 13.0–17.7)
Immature Grans (Abs): 0 10*3/uL (ref 0.0–0.1)
Immature Granulocytes: 0 %
Lymphocytes Absolute: 2.3 10*3/uL (ref 0.7–3.1)
Lymphs: 39 %
MCH: 30.8 pg (ref 26.6–33.0)
MCHC: 35.1 g/dL (ref 31.5–35.7)
MCV: 88 fL (ref 79–97)
Monocytes Absolute: 0.6 10*3/uL (ref 0.1–0.9)
Monocytes: 10 %
Neutrophils Absolute: 3 10*3/uL (ref 1.4–7.0)
Neutrophils: 49 %
Platelets: 271 10*3/uL (ref 150–450)
RBC: 5.04 x10E6/uL (ref 4.14–5.80)
RDW: 13.5 % (ref 11.6–15.4)
WBC: 6.1 10*3/uL (ref 3.4–10.8)

## 2019-04-12 LAB — HEMOGLOBIN A1C
Est. average glucose Bld gHb Est-mCnc: 126 mg/dL
Hgb A1c MFr Bld: 6 % — ABNORMAL HIGH (ref 4.8–5.6)

## 2019-04-12 LAB — TSH: TSH: 1.42 u[IU]/mL (ref 0.450–4.500)

## 2019-04-12 NOTE — Telephone Encounter (Signed)
-----   Message from [name], MD sent at 04/12/2019  8:29 AM EDT ----- Labs good.

## 2019-04-12 NOTE — Telephone Encounter (Signed)
Patient notified of lab results

## 2019-09-26 DIAGNOSIS — M5137 Other intervertebral disc degeneration, lumbosacral region: Secondary | ICD-10-CM | POA: Diagnosis not present

## 2019-09-26 DIAGNOSIS — M9904 Segmental and somatic dysfunction of sacral region: Secondary | ICD-10-CM | POA: Diagnosis not present

## 2019-09-26 DIAGNOSIS — M9903 Segmental and somatic dysfunction of lumbar region: Secondary | ICD-10-CM | POA: Diagnosis not present

## 2019-09-26 DIAGNOSIS — M7918 Myalgia, other site: Secondary | ICD-10-CM | POA: Diagnosis not present

## 2019-09-26 DIAGNOSIS — M5441 Lumbago with sciatica, right side: Secondary | ICD-10-CM | POA: Diagnosis not present

## 2019-09-26 DIAGNOSIS — M5136 Other intervertebral disc degeneration, lumbar region: Secondary | ICD-10-CM | POA: Diagnosis not present

## 2019-09-28 DIAGNOSIS — M9904 Segmental and somatic dysfunction of sacral region: Secondary | ICD-10-CM | POA: Diagnosis not present

## 2019-09-28 DIAGNOSIS — M5137 Other intervertebral disc degeneration, lumbosacral region: Secondary | ICD-10-CM | POA: Diagnosis not present

## 2019-09-28 DIAGNOSIS — M5136 Other intervertebral disc degeneration, lumbar region: Secondary | ICD-10-CM | POA: Diagnosis not present

## 2019-09-28 DIAGNOSIS — M5441 Lumbago with sciatica, right side: Secondary | ICD-10-CM | POA: Diagnosis not present

## 2019-09-28 DIAGNOSIS — M7918 Myalgia, other site: Secondary | ICD-10-CM | POA: Diagnosis not present

## 2019-09-28 DIAGNOSIS — M9903 Segmental and somatic dysfunction of lumbar region: Secondary | ICD-10-CM | POA: Diagnosis not present

## 2019-10-01 DIAGNOSIS — M7918 Myalgia, other site: Secondary | ICD-10-CM | POA: Diagnosis not present

## 2019-10-01 DIAGNOSIS — M5137 Other intervertebral disc degeneration, lumbosacral region: Secondary | ICD-10-CM | POA: Diagnosis not present

## 2019-10-01 DIAGNOSIS — M5441 Lumbago with sciatica, right side: Secondary | ICD-10-CM | POA: Diagnosis not present

## 2019-10-01 DIAGNOSIS — M5136 Other intervertebral disc degeneration, lumbar region: Secondary | ICD-10-CM | POA: Diagnosis not present

## 2019-10-01 DIAGNOSIS — M9904 Segmental and somatic dysfunction of sacral region: Secondary | ICD-10-CM | POA: Diagnosis not present

## 2019-10-01 DIAGNOSIS — M9903 Segmental and somatic dysfunction of lumbar region: Secondary | ICD-10-CM | POA: Diagnosis not present

## 2019-10-02 DIAGNOSIS — M5441 Lumbago with sciatica, right side: Secondary | ICD-10-CM | POA: Diagnosis not present

## 2019-10-02 DIAGNOSIS — M5137 Other intervertebral disc degeneration, lumbosacral region: Secondary | ICD-10-CM | POA: Diagnosis not present

## 2019-10-02 DIAGNOSIS — M5136 Other intervertebral disc degeneration, lumbar region: Secondary | ICD-10-CM | POA: Diagnosis not present

## 2019-10-02 DIAGNOSIS — M7918 Myalgia, other site: Secondary | ICD-10-CM | POA: Diagnosis not present

## 2019-10-02 DIAGNOSIS — M9903 Segmental and somatic dysfunction of lumbar region: Secondary | ICD-10-CM | POA: Diagnosis not present

## 2019-10-02 DIAGNOSIS — M9904 Segmental and somatic dysfunction of sacral region: Secondary | ICD-10-CM | POA: Diagnosis not present

## 2019-10-08 DIAGNOSIS — M9904 Segmental and somatic dysfunction of sacral region: Secondary | ICD-10-CM | POA: Diagnosis not present

## 2019-10-08 DIAGNOSIS — M7918 Myalgia, other site: Secondary | ICD-10-CM | POA: Diagnosis not present

## 2019-10-08 DIAGNOSIS — M5441 Lumbago with sciatica, right side: Secondary | ICD-10-CM | POA: Diagnosis not present

## 2019-10-08 DIAGNOSIS — M9903 Segmental and somatic dysfunction of lumbar region: Secondary | ICD-10-CM | POA: Diagnosis not present

## 2019-10-08 DIAGNOSIS — M5137 Other intervertebral disc degeneration, lumbosacral region: Secondary | ICD-10-CM | POA: Diagnosis not present

## 2019-10-08 DIAGNOSIS — M5136 Other intervertebral disc degeneration, lumbar region: Secondary | ICD-10-CM | POA: Diagnosis not present

## 2019-10-10 DIAGNOSIS — M5441 Lumbago with sciatica, right side: Secondary | ICD-10-CM | POA: Diagnosis not present

## 2019-10-10 DIAGNOSIS — M9904 Segmental and somatic dysfunction of sacral region: Secondary | ICD-10-CM | POA: Diagnosis not present

## 2019-10-10 DIAGNOSIS — M5136 Other intervertebral disc degeneration, lumbar region: Secondary | ICD-10-CM | POA: Diagnosis not present

## 2019-10-10 DIAGNOSIS — M5137 Other intervertebral disc degeneration, lumbosacral region: Secondary | ICD-10-CM | POA: Diagnosis not present

## 2019-10-10 DIAGNOSIS — M7918 Myalgia, other site: Secondary | ICD-10-CM | POA: Diagnosis not present

## 2019-10-10 DIAGNOSIS — M9903 Segmental and somatic dysfunction of lumbar region: Secondary | ICD-10-CM | POA: Diagnosis not present

## 2019-10-12 DIAGNOSIS — M5441 Lumbago with sciatica, right side: Secondary | ICD-10-CM | POA: Diagnosis not present

## 2019-10-12 DIAGNOSIS — M5137 Other intervertebral disc degeneration, lumbosacral region: Secondary | ICD-10-CM | POA: Diagnosis not present

## 2019-10-12 DIAGNOSIS — M9903 Segmental and somatic dysfunction of lumbar region: Secondary | ICD-10-CM | POA: Diagnosis not present

## 2019-10-12 DIAGNOSIS — M9904 Segmental and somatic dysfunction of sacral region: Secondary | ICD-10-CM | POA: Diagnosis not present

## 2019-10-12 DIAGNOSIS — M7918 Myalgia, other site: Secondary | ICD-10-CM | POA: Diagnosis not present

## 2019-10-12 DIAGNOSIS — M5136 Other intervertebral disc degeneration, lumbar region: Secondary | ICD-10-CM | POA: Diagnosis not present

## 2019-10-15 DIAGNOSIS — M7918 Myalgia, other site: Secondary | ICD-10-CM | POA: Diagnosis not present

## 2019-10-15 DIAGNOSIS — M5441 Lumbago with sciatica, right side: Secondary | ICD-10-CM | POA: Diagnosis not present

## 2019-10-15 DIAGNOSIS — M5136 Other intervertebral disc degeneration, lumbar region: Secondary | ICD-10-CM | POA: Diagnosis not present

## 2019-10-15 DIAGNOSIS — M9904 Segmental and somatic dysfunction of sacral region: Secondary | ICD-10-CM | POA: Diagnosis not present

## 2019-10-15 DIAGNOSIS — M5137 Other intervertebral disc degeneration, lumbosacral region: Secondary | ICD-10-CM | POA: Diagnosis not present

## 2019-10-15 DIAGNOSIS — M9903 Segmental and somatic dysfunction of lumbar region: Secondary | ICD-10-CM | POA: Diagnosis not present

## 2019-10-17 DIAGNOSIS — M5137 Other intervertebral disc degeneration, lumbosacral region: Secondary | ICD-10-CM | POA: Diagnosis not present

## 2019-10-17 DIAGNOSIS — M5441 Lumbago with sciatica, right side: Secondary | ICD-10-CM | POA: Diagnosis not present

## 2019-10-17 DIAGNOSIS — M5136 Other intervertebral disc degeneration, lumbar region: Secondary | ICD-10-CM | POA: Diagnosis not present

## 2019-10-17 DIAGNOSIS — M9903 Segmental and somatic dysfunction of lumbar region: Secondary | ICD-10-CM | POA: Diagnosis not present

## 2019-10-17 DIAGNOSIS — M7918 Myalgia, other site: Secondary | ICD-10-CM | POA: Diagnosis not present

## 2019-10-17 DIAGNOSIS — M9904 Segmental and somatic dysfunction of sacral region: Secondary | ICD-10-CM | POA: Diagnosis not present

## 2019-10-19 DIAGNOSIS — M5441 Lumbago with sciatica, right side: Secondary | ICD-10-CM | POA: Diagnosis not present

## 2019-10-19 DIAGNOSIS — M5137 Other intervertebral disc degeneration, lumbosacral region: Secondary | ICD-10-CM | POA: Diagnosis not present

## 2019-10-19 DIAGNOSIS — M9903 Segmental and somatic dysfunction of lumbar region: Secondary | ICD-10-CM | POA: Diagnosis not present

## 2019-10-19 DIAGNOSIS — M7918 Myalgia, other site: Secondary | ICD-10-CM | POA: Diagnosis not present

## 2019-10-19 DIAGNOSIS — M5136 Other intervertebral disc degeneration, lumbar region: Secondary | ICD-10-CM | POA: Diagnosis not present

## 2019-10-19 DIAGNOSIS — M9904 Segmental and somatic dysfunction of sacral region: Secondary | ICD-10-CM | POA: Diagnosis not present

## 2019-12-11 ENCOUNTER — Ambulatory Visit: Payer: Medicare Other | Admitting: Urology

## 2019-12-11 IMAGING — CR DG FOOT COMPLETE 3+V*R*
1 series · 3 of 3 positions shown · non-contrast
Comparison: None.

CLINICAL DATA: Pain in the arch of the foot and medial aspect. No
trauma.

EXAM:
RIGHT FOOT COMPLETE - 3+ VIEW

[Series 1: dg foot complete right · 0.14mm/px · 3 of 3 slices shown]
[im 1/3]
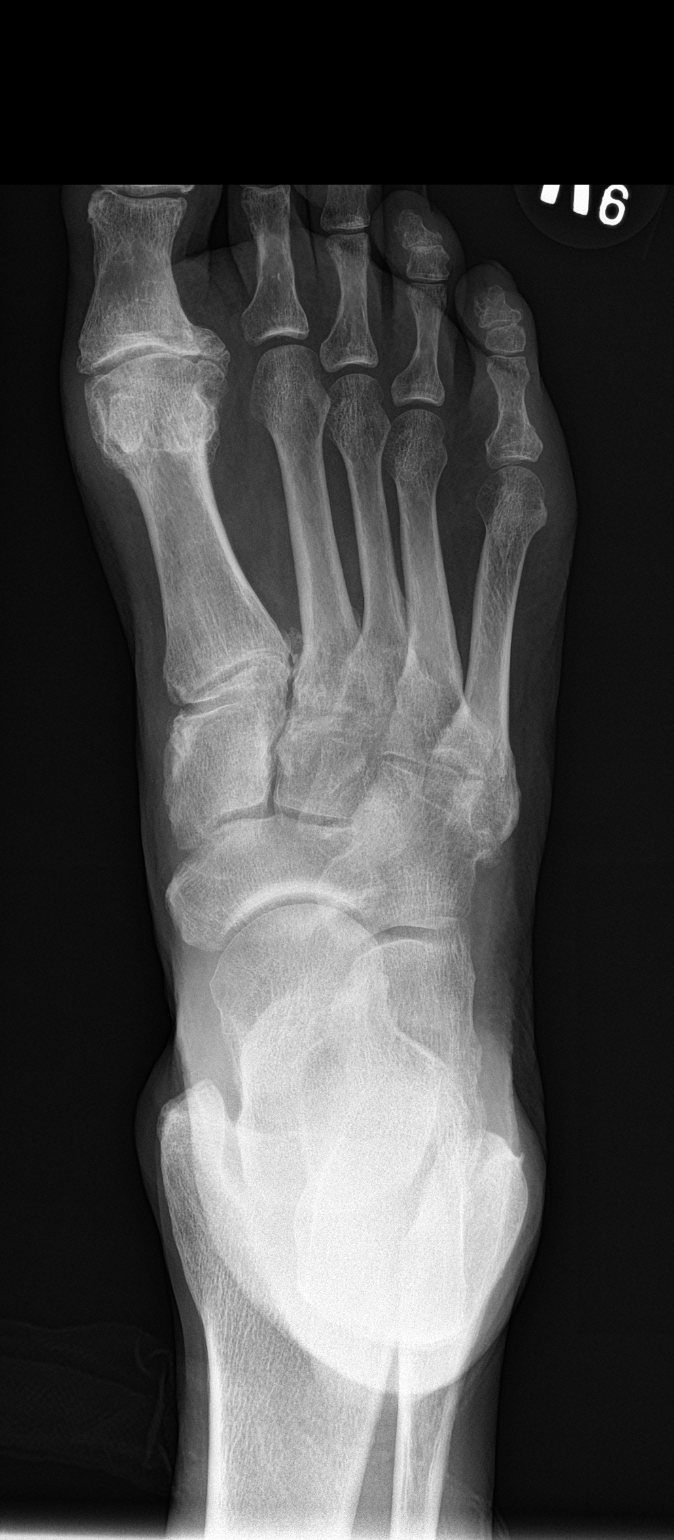
[im 2/3]
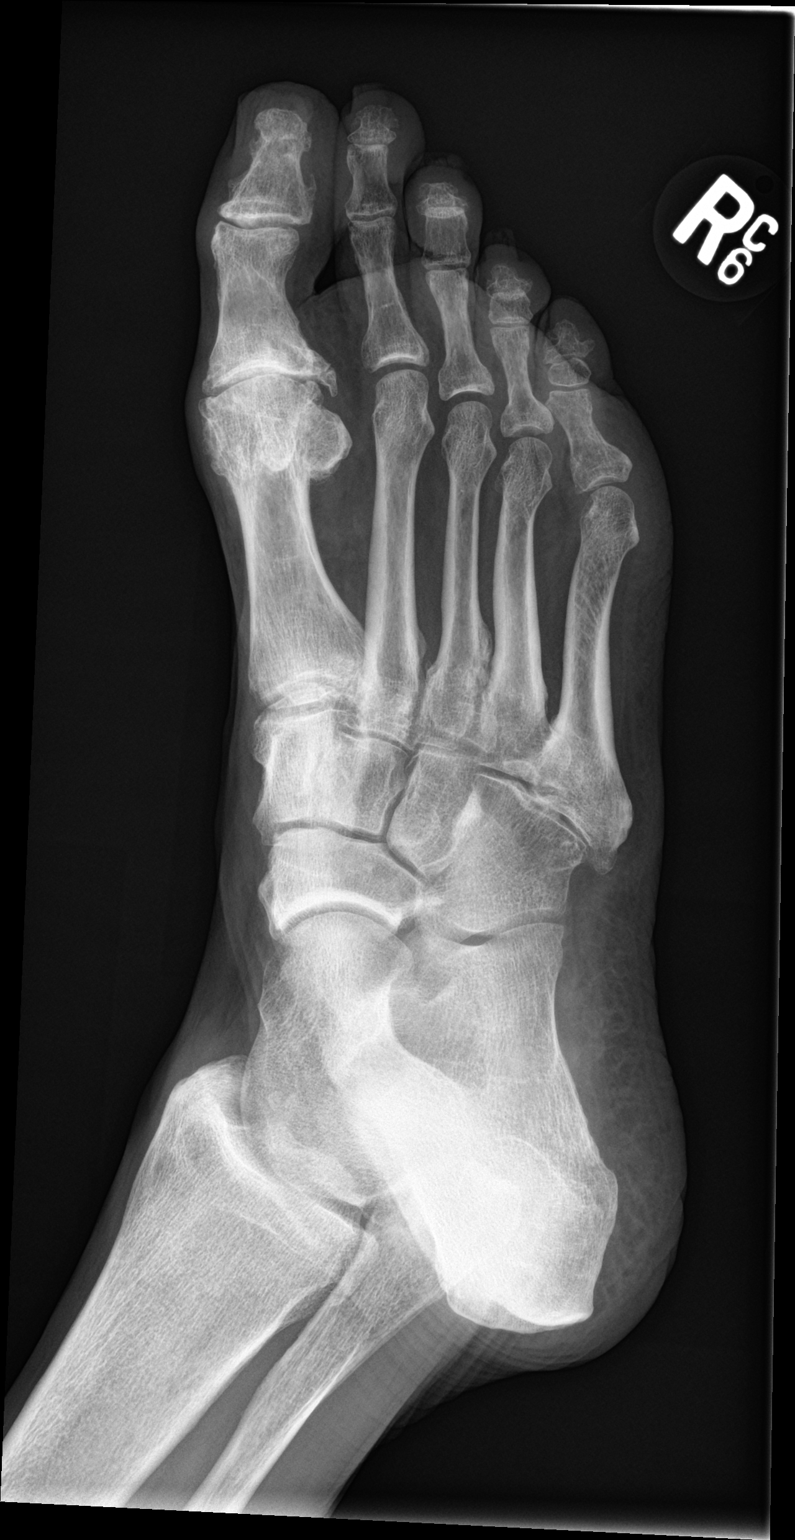
[im 3/3]
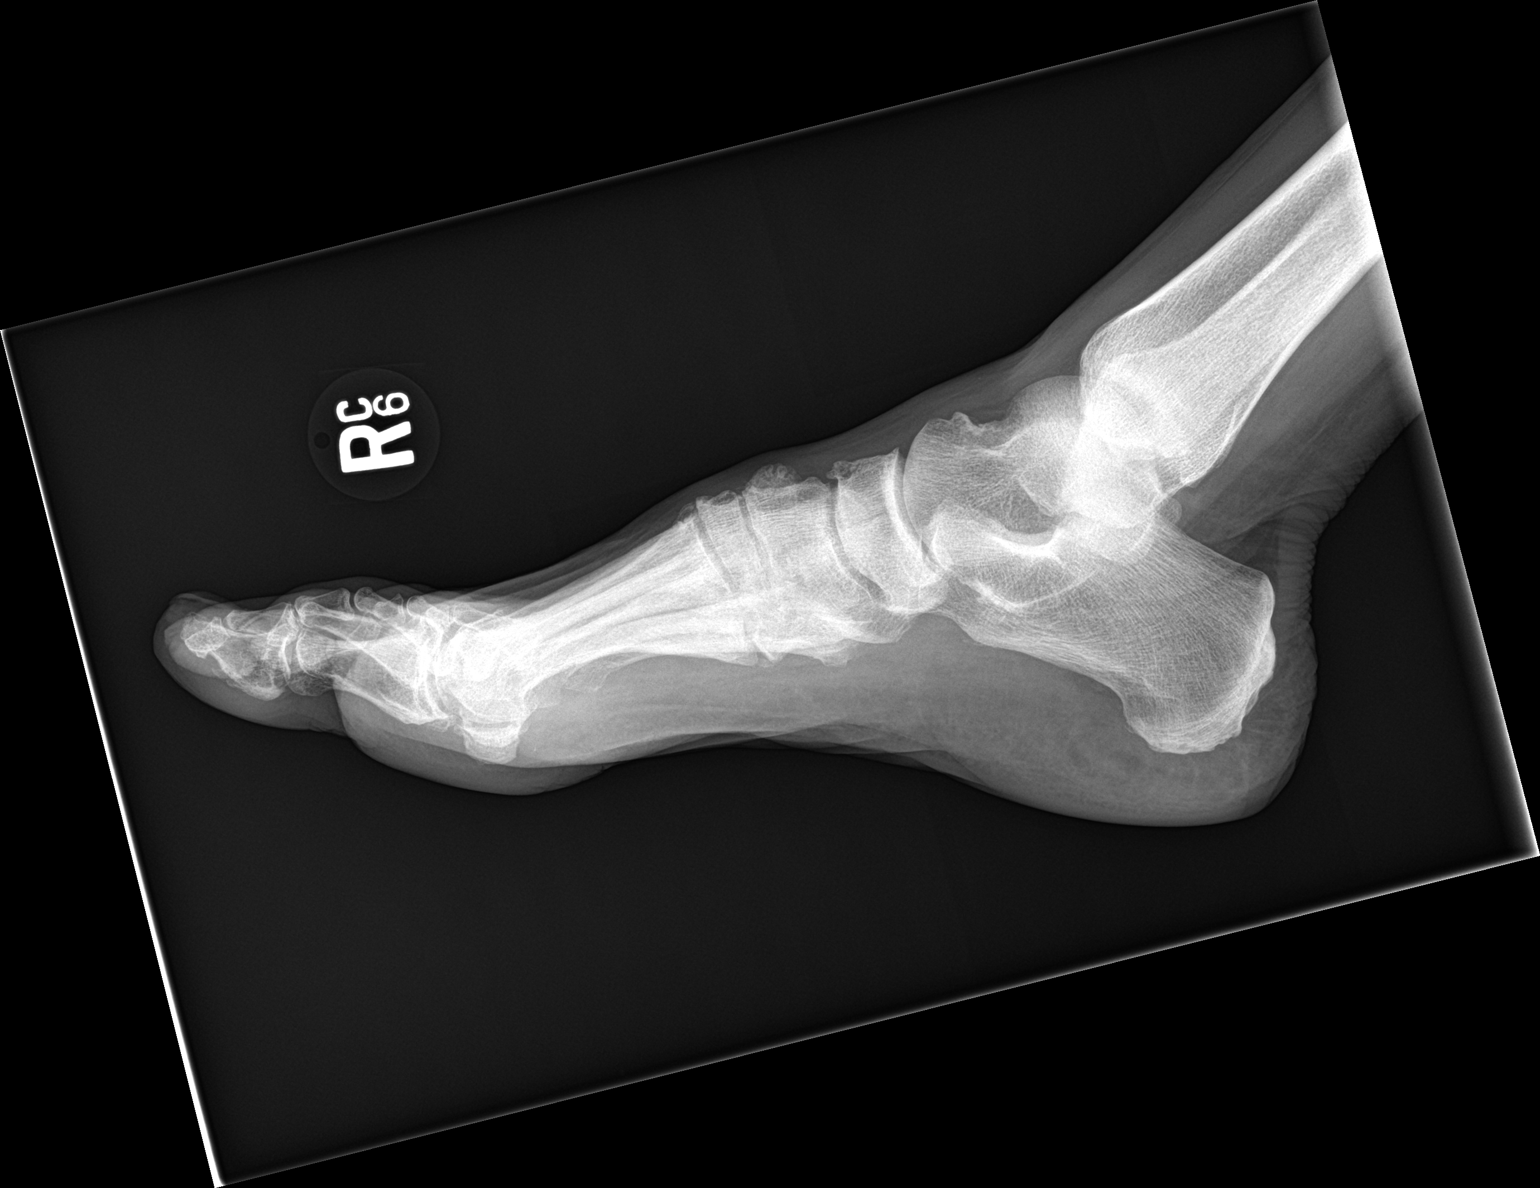

[3 of 3 positions shown; findings below may reference images not displayed]

FINDINGS: There is significant degenerative joint disease involving the right
first MTP joint with loss of joint space and sclerosis with
considerable spurring. The remainder of joint spaces appear normal.
No erosion is seen. Degenerative spurring is noted in the midfoot as
well. No acute abnormality is seen.
IMPRESSION: 1. Degenerative change of the right first MTP joint.
2. Degenerative change in the midfoot.  No acute abnormality.

## 2019-12-13 ENCOUNTER — Ambulatory Visit: Payer: Medicare Other | Admitting: Urology

## 2020-01-30 ENCOUNTER — Ambulatory Visit (INDEPENDENT_AMBULATORY_CARE_PROVIDER_SITE_OTHER): Payer: Medicare Other | Admitting: Urology

## 2020-01-30 ENCOUNTER — Encounter: Payer: Self-pay | Admitting: Urology

## 2020-01-30 ENCOUNTER — Other Ambulatory Visit: Payer: Self-pay

## 2020-01-30 VITALS — BP 136/87 | HR 90 | Ht 74.0 in | Wt 199.8 lb

## 2020-01-30 DIAGNOSIS — N400 Enlarged prostate without lower urinary tract symptoms: Secondary | ICD-10-CM

## 2020-01-30 DIAGNOSIS — N529 Male erectile dysfunction, unspecified: Secondary | ICD-10-CM | POA: Diagnosis not present

## 2020-01-30 DIAGNOSIS — N390 Urinary tract infection, site not specified: Secondary | ICD-10-CM | POA: Diagnosis not present

## 2020-01-30 LAB — BLADDER SCAN AMB NON-IMAGING

## 2020-01-30 MED ORDER — TADALAFIL 5 MG PO TABS: 10.0000 mg | ORAL_TABLET | Freq: Every day | ORAL | 11 refills | Status: DC | PRN

## 2020-01-30 NOTE — Patient Instructions (Signed)
Tadalafil tablets (Cialis) What is this medicine? TADALAFIL (tah DA la fil) is used to treat erection problems in men. It is also used for enlargement of the prostate gland in men, a condition called benign prostatic hyperplasia or BPH. This medicine improves urine flow and reduces BPH symptoms. This medicine can also treat both erection problems and BPH when they occur together. This medicine may be used for other purposes; ask your health care provider or pharmacist if you have questions. COMMON BRAND NAME(S): Adcirca, ALYQ, Cialis What should I tell my health care provider before I take this medicine? They need to know if you have any of these conditions:  bleeding disorders  eye or vision problems, including a rare inherited eye disease called retinitis pigmentosa  anatomical deformation of the penis, Peyronie's disease, or history of priapism (painful and prolonged erection)  heart disease, angina, a history of heart attack, irregular heart beats, or other heart problems  high or low blood pressure  history of blood diseases, like sickle cell anemia or leukemia  history of stomach bleeding  kidney disease  liver disease  stroke  an unusual or allergic reaction to tadalafil, other medicines, foods, dyes, or preservatives  pregnant or trying to get pregnant  breast-feeding How should I use this medicine? Take this medicine by mouth with a glass of water. Follow the directions on the prescription label. You may take this medicine with or without meals. When this medicine is used for erection problems, your doctor may prescribe it to be taken once daily or as needed. If you are taking the medicine as needed, you may be able to have sexual activity 30 minutes after taking it and for up to 36 hours after taking it. Whether you are taking the medicine as needed or once daily, you should not take more than one dose per day. If you are taking this medicine for symptoms of benign  prostatic hyperplasia (BPH) or to treat both BPH and an erection problem, take the dose once daily at about the same time each day. Do not take your medicine more often than directed. Talk to your pediatrician regarding the use of this medicine in children. Special care may be needed. Overdosage: If you think you have taken too much of this medicine contact a poison control center or emergency room at once. NOTE: This medicine is only for you. Do not share this medicine with others. What if I miss a dose? If you are taking this medicine as needed for erection problems, this does not apply. If you miss a dose while taking this medicine once daily for an erection problem, benign prostatic hyperplasia, or both, take it as soon as you remember, but do not take more than one dose per day. What may interact with this medicine? Do not take this medicine with any of the following medications:  nitrates like amyl nitrite, isosorbide dinitrate, isosorbide mononitrate, nitroglycerin  other medicines for erectile dysfunction like avanafil, sildenafil, vardenafil  other tadalafil products (Adcirca)  riociguat This medicine may also interact with the following medications:  certain drugs for high blood pressure  certain drugs for the treatment of HIV infection or AIDS  certain drugs used for fungal or yeast infections, like fluconazole, itraconazole, ketoconazole, and voriconazole  certain drugs used for seizures like carbamazepine, phenytoin, and phenobarbital  grapefruit juice  macrolide antibiotics like clarithromycin, erythromycin, troleandomycin  medicines for prostate problems  rifabutin, rifampin or rifapentine This list may not describe all possible interactions. Give your   health care provider a list of all the medicines, herbs, non-prescription drugs, or dietary supplements you use. Also tell them if you smoke, drink alcohol, or use illegal drugs. Some items may interact with your  medicine. What should I watch for while using this medicine? If you notice any changes in your vision while taking this drug, call your doctor or health care professional as soon as possible. Stop using this medicine and call your health care provider right away if you have a loss of sight in one or both eyes. Contact your doctor or health care professional right away if the erection lasts longer than 4 hours or if it becomes painful. This may be a sign of serious problem and must be treated right away to prevent permanent damage. If you experience symptoms of nausea, dizziness, chest pain or arm pain upon initiation of sexual activity after taking this medicine, you should refrain from further activity and call your doctor or health care professional as soon as possible. Do not drink alcohol to excess (examples, 5 glasses of wine or 5 shots of whiskey) when taking this medicine. When taken in excess, alcohol can increase your chances of getting a headache or getting dizzy, increasing your heart rate or lowering your blood pressure. Using this medicine does not protect you or your partner against HIV infection (the virus that causes AIDS) or other sexually transmitted diseases. What side effects may I notice from receiving this medicine? Side effects that you should report to your doctor or health care professional as soon as possible:  allergic reactions like skin rash, itching or hives, swelling of the face, lips, or tongue  breathing problems  changes in hearing  changes in vision  chest pain  fast, irregular heartbeat  prolonged or painful erection  seizures Side effects that usually do not require medical attention (report to your doctor or health care professional if they continue or are bothersome):  back pain  dizziness  flushing  headache  indigestion  muscle aches  nausea  stuffy or runny nose This list may not describe all possible side effects. Call your doctor  for medical advice about side effects. You may report side effects to FDA at 1-800-FDA-1088. Where should I keep my medicine? Keep out of the reach of children. Store at room temperature between 15 and 30 degrees C (59 and 86 degrees F). Throw away any unused medicine after the expiration date. NOTE: This sheet is a summary. It may not cover all possible information. If you have questions about this medicine, talk to your doctor, pharmacist, or health care provider.  2020 [employer]vier/Gold Standard (2014-03-15 13:15:49)  

## 2020-01-30 NOTE — Progress Notes (Signed)
   01/30/2020 4:17 PM   David Trevino 13-Jul-1946 [id number]  Reason for visit: Follow up recurrent UTI, ED  HPI: I saw [name] in urology clinic today for follow-up of recurrent urinary tract infections, as well as erectile dysfunction.  He is a 74 year old male who had 3 E. coli UTIs from September to November 2019.  This likely represented a prostatitis with incomplete treatment, and he reports that his symptoms ultimately resolved after a longer course of antibiotics.  He underwent work-up with me with a CT urogram that was benign and showed a 36 g prostate, and clinic cystoscopy showed a trabeculated bladder but no suspicious lesions.  We discussed cranberry prophylaxis and behavioral strategies at that time.  He denies any UTIs since our last visit a year ago.  He denies any urinary complaints today.  PVR is normal at 115 mL.  He denies any gross hematuria.    He does report erectile dysfunction over the last few years and is interested in trying a medication for this.  He is never tried any medications for this previously.  We discussed the risks and benefits of PDE 5 inhibitors at length, as well as alternatives, and he would like to try Cialis on demand.  Cialis 10 mg on demand for ED, max dose 20 mg He would like to follow-up as needed.  Return precautions discussed at length.  Billey Co, [facility] [address] [place],  [address] [phone]

## 2020-01-31 IMAGING — MR MR ABDOMEN WO/W CM
20 series · 48 of 48 positions shown · IV contrast (Gadavist)
Comparison: 10/04/2018

CLINICAL DATA: Small liver lesion for further workup

EXAM:
MRI ABDOMEN WITHOUT AND WITH CONTRAST
TECHNIQUE: Multiplanar multisequence MR imaging of the abdomen was performed
both before and after the administration of intravenous contrast.
CONTRAST:  9 cc Gadavist

[Series 3: T2 · coronal · 6.0mm · 1.19mm/px · 2 of 30 slices shown (1 of 2)]
[im 1/30]
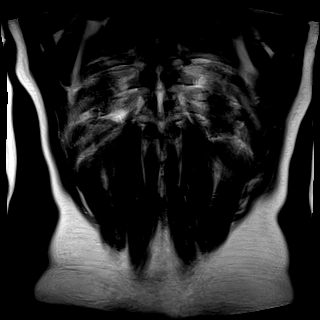
[im 30/30]
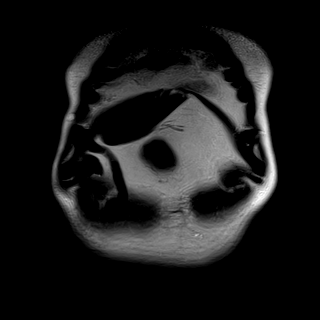

[Series 4: T2 · axial · 6.0mm · 1.19mm/px · z∈[-7,+216]mm · 2 of 32 slices shown (2 of 2)]
[im 1/32]
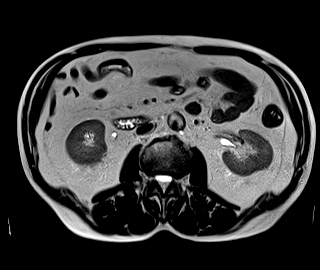
[im 32/32]
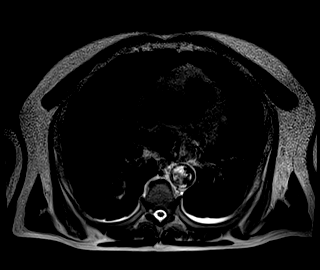

[Series 6: T2 fat-sat · axial · 6.0mm · 1.19mm/px · z∈[-18,+219]mm · 2 of 34 slices shown]
[im 1/34]
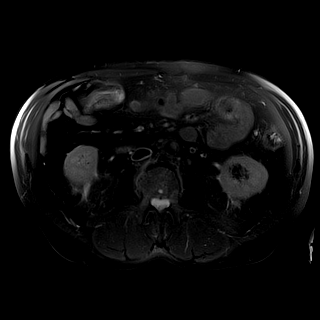
[im 34/34]
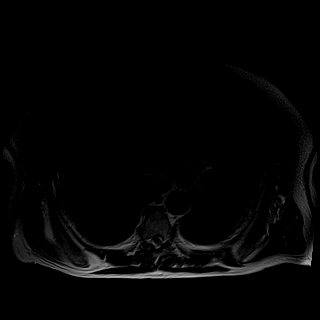

[Series 7: ax dwi_tracew · axial · 6.0mm · 1.42mm/px · z∈[-18,+219]mm · 2 of 34 slices shown (1 of 3)]
[im 1/34]
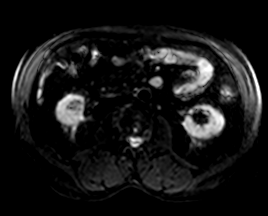
[im 34/34]
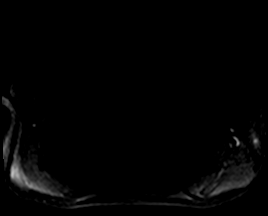

[Series 7: ax dwi_tracew · axial · 6.0mm · 1.42mm/px · z∈[-18,+219]mm · 2 of 34 slices shown (2 of 3)]
[im 1/34]
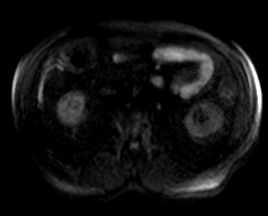
[im 34/34]
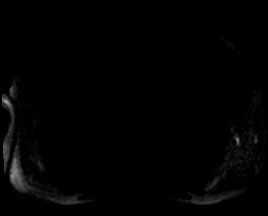

[Series 7: ax dwi_tracew · axial · 6.0mm · 1.42mm/px · z∈[-18,+219]mm · 2 of 34 slices shown (3 of 3)]
[im 1/34]
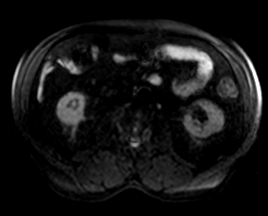
[im 34/34]
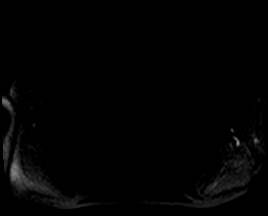

[Series 8: ax dwi_adc · axial · 6.0mm · 1.42mm/px · z∈[-18,+219]mm · 2 of 34 slices shown]
[im 1/34]
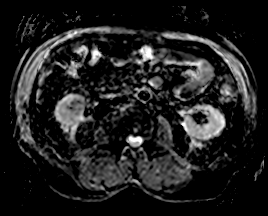
[im 34/34]
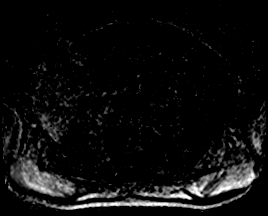

[Series 9: T1 · axial · 6.0mm · 0.74mm/px · z∈[-7,+216]mm · 2 of 32 slices shown (1 of 2)]
[im 1/32]
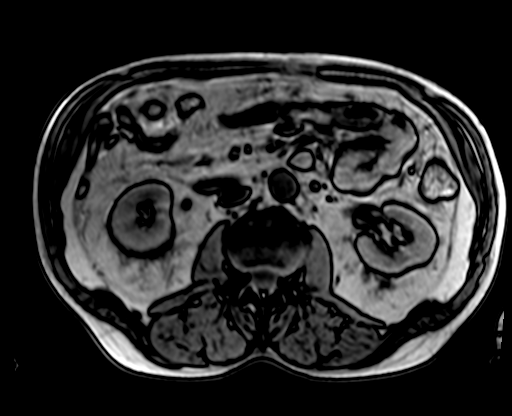
[im 32/32]
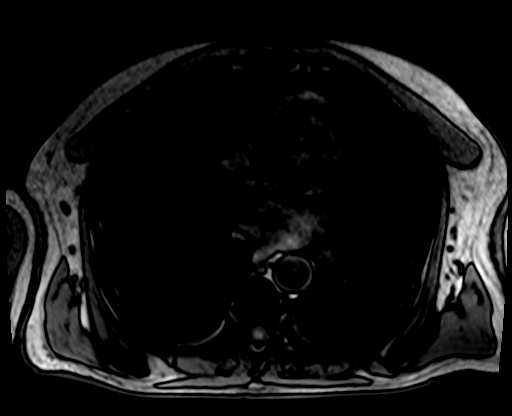

[Series 9: T1 · axial · 6.0mm · 0.74mm/px · 1 of 32 slices shown (2 of 2)]
[im 1/32]
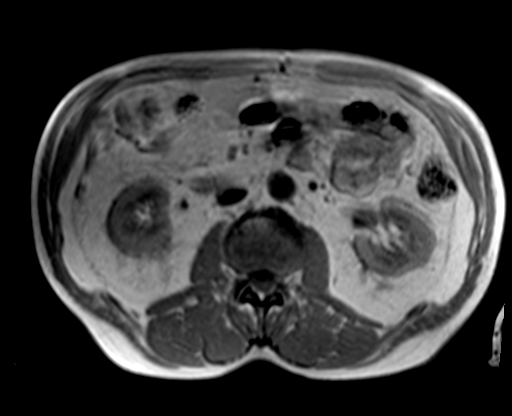

[Series 10: bSSFP · axial · 6.0mm · 0.74mm/px · 1 of 32 slices shown]
[im 1/32]
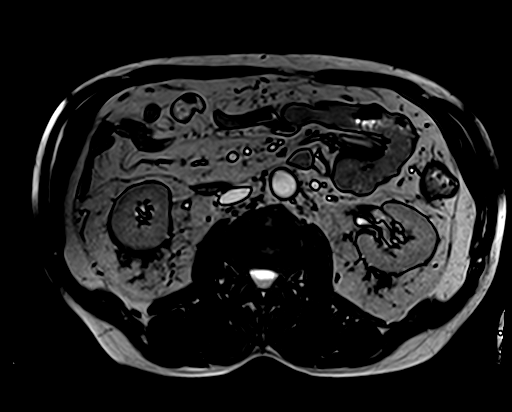

[Series 11: T1 dynamic fat-sat · axial · non-contrast · 3.0mm · 1.19mm/px · z∈[-14,+223]mm · 3 of 80 slices shown (1 of 5)]
[im 1/80]
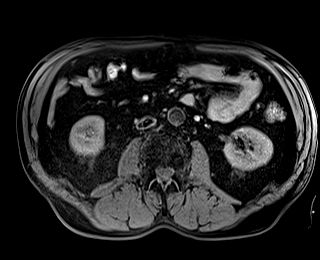
[im 40/80]
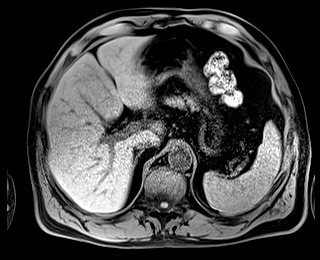
[im 80/80]
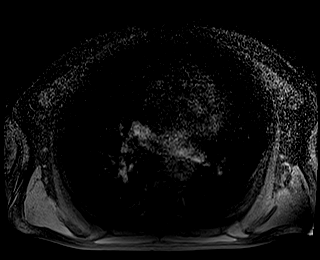

[Series 12: T1 dynamic fat-sat post-contrast · axial · 3.0mm · 1.19mm/px · z∈[-14,+223]mm · 3 of 80 slices shown (1 of 4)]
[im 1/80]
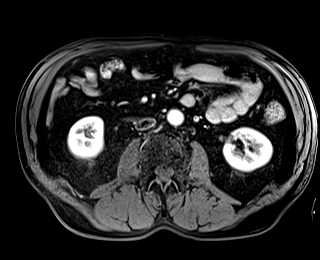
[im 40/80]
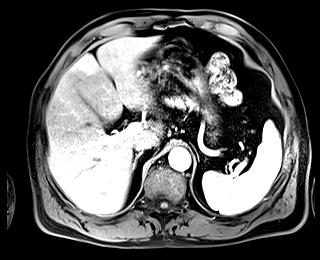
[im 80/80]
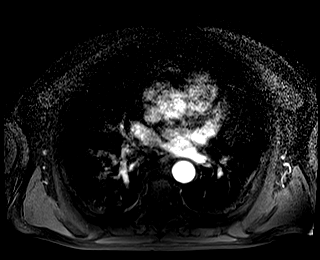

[Series 13: T1 dynamic fat-sat · axial · 3.0mm · 1.19mm/px · z∈[-14,+223]mm · 3 of 80 slices shown (2 of 5)]
[im 1/80]
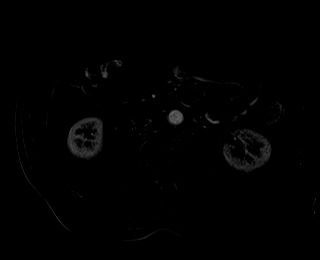
[im 40/80]
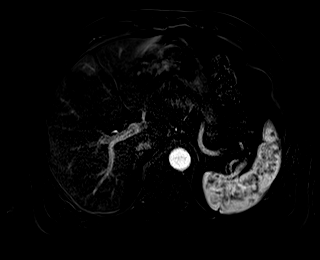
[im 80/80]
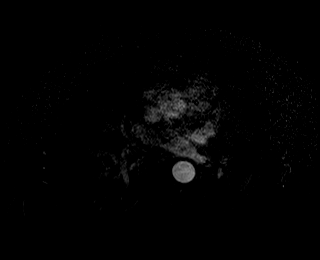

[Series 14: T1 dynamic fat-sat post-contrast · axial · 3.0mm · 1.19mm/px · z∈[-14,+223]mm · 3 of 80 slices shown (2 of 4)]
[im 1/80]
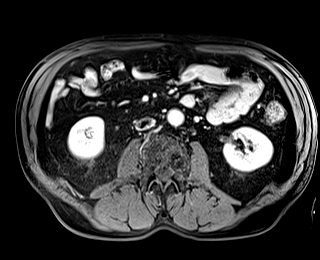
[im 40/80]
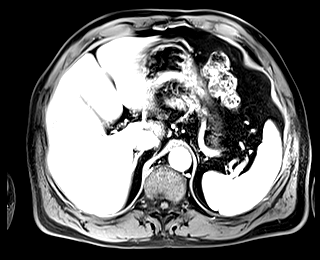
[im 80/80]
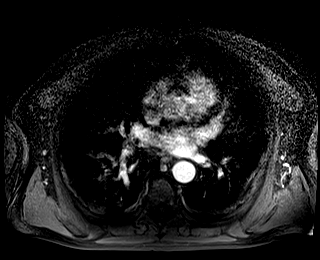

[Series 15: T1 dynamic fat-sat · axial · 3.0mm · 1.19mm/px · z∈[-14,+223]mm · 3 of 80 slices shown (3 of 5)]
[im 1/80]
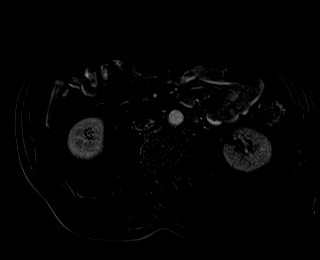
[im 40/80]
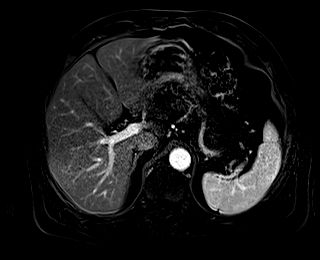
[im 80/80]
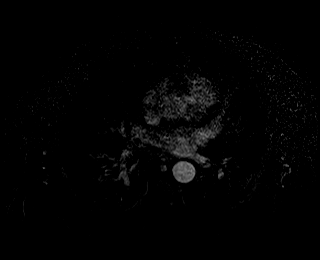

[Series 16: T1 dynamic fat-sat post-contrast · axial · 3.0mm · 1.19mm/px · z∈[-14,+223]mm · 3 of 80 slices shown (3 of 4)]
[im 1/80]
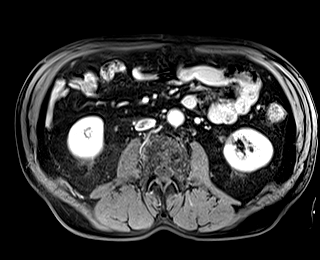
[im 40/80]
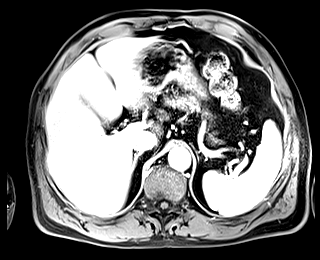
[im 80/80]
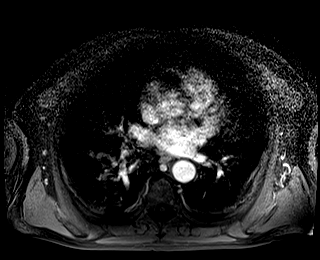

[Series 17: T1 dynamic fat-sat · axial · 3.0mm · 1.19mm/px · z∈[-14,+223]mm · 3 of 80 slices shown (4 of 5)]
[im 1/80]
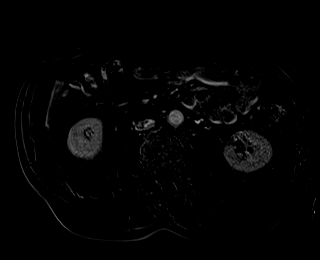
[im 40/80]
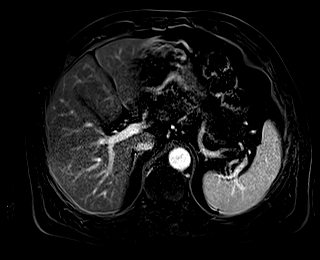
[im 80/80]
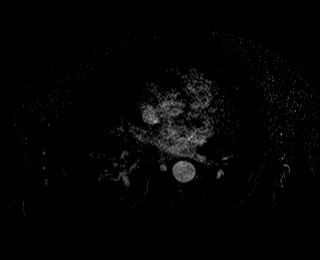

[Series 19: T1 dynamic fat-sat post-contrast · axial · 3.0mm · 1.19mm/px · z∈[-14,+223]mm · 3 of 80 slices shown (4 of 4)]
[im 1/80]
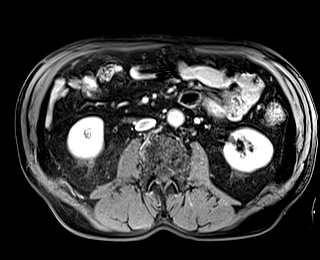
[im 40/80]
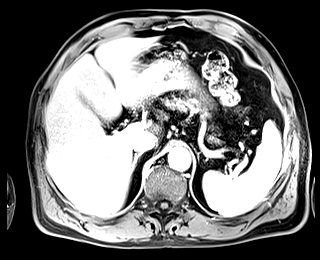
[im 80/80]
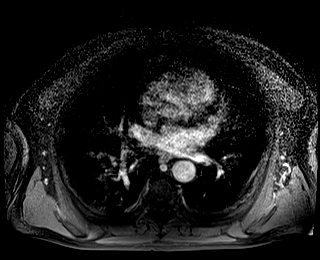

[Series 20: T1 dynamic fat-sat · axial · 3.0mm · 1.19mm/px · z∈[-14,+223]mm · 3 of 80 slices shown (5 of 5)]
[im 1/80]
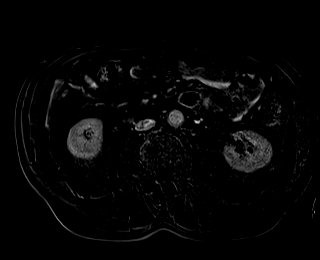
[im 40/80]
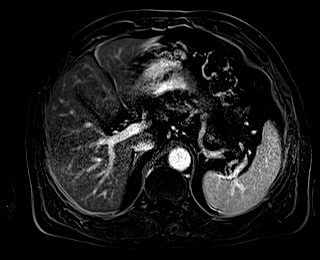
[im 80/80]
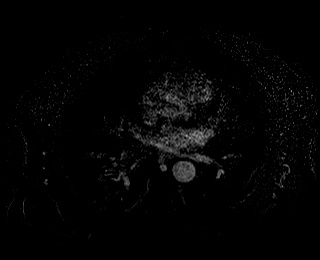

[Series 21: T1 dynamic post-contrast · coronal · 3.0mm · 1.31mm/px · 3 of 72 slices shown]
[im 1/72]
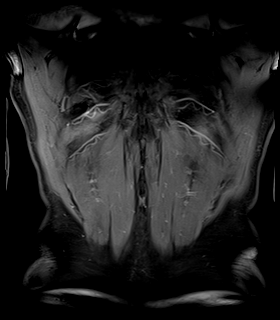
[im 36/72]
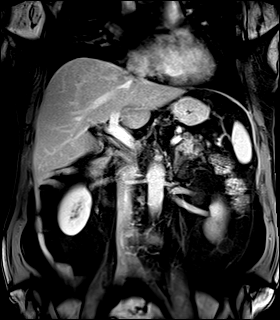
[im 72/72]
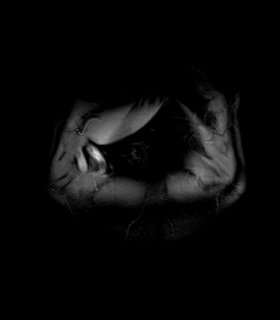

[48 of 48 positions shown; findings below may reference images not displayed]

FINDINGS: Lower chest: Mild cardiomegaly.  Minimal gynecomastia.

Hepatobiliary: The 0.7 by 0.5 cm lesion of concern in segment 2 of
the liver on the prior CT scan has high T2 and low T1 signal
characteristics with early and prolonged enhancement compatible with
a small flash filling hemangioma.

There is an adjacent 1.4 by 0.7 cm lesion in the lateral segment
left hepatic lobe on image [DATE] likewise demonstrating early and
prolonged enhancement compatible with a small hemangioma.

Peripherally in the right hepatic lobe, a 0.9 by 0.6 cm lesion on
image [DATE] likewise has high T2 signal and initial peripheral
subsequent complete enhancement with delayed enhancement,
characteristic of a small hemangioma. A questionable fourth linear
delayed enhancing lesion is present in segment 4B of the liver on
image 43/17.

Gallbladder unremarkable. No biliary dilatation. No other liver
lesions are identified.

Pancreas:  Unremarkable

Spleen:  Unremarkable

Adrenals/Urinary Tract: 1.5 by 1.7 cm left adrenal adenoma. The tiny
punctate renal calculi seen previously are not conspicuous on MRI.
Left kidney lower pole simple cyst.

Stomach/Bowel: Unremarkable

Vascular/Lymphatic:  Aortoiliac atherosclerotic vascular disease.

Other:  No supplemental non-categorized findings.

Musculoskeletal: Hemangioma at L3.
IMPRESSION: 1. Several small benign hepatic hemangiomas are present, including
the lesion identified on prior CT scan. These do not require further
workup.
2. Small benign left adrenal adenoma.
3.  Aortic Atherosclerosis ([id number]).
4. Mild cardiomegaly.

## 2020-02-14 DIAGNOSIS — Z23 Encounter for immunization: Secondary | ICD-10-CM | POA: Diagnosis not present

## 2020-03-06 DIAGNOSIS — Z23 Encounter for immunization: Secondary | ICD-10-CM | POA: Diagnosis not present

## 2020-03-31 ENCOUNTER — Other Ambulatory Visit: Payer: Self-pay | Admitting: Family Medicine

## 2020-03-31 DIAGNOSIS — R0982 Postnasal drip: Secondary | ICD-10-CM

## 2020-03-31 DIAGNOSIS — E039 Hypothyroidism, unspecified: Secondary | ICD-10-CM

## 2020-04-10 NOTE — Progress Notes (Signed)
Subjective:   David Trevino is a 74 y.o. male who presents for Medicare Annual/Subsequent preventive examination.  Review of Systems:  N/A  Cardiac Risk Factors include: advanced age (>21men, >12 women);male gender;dyslipidemia     Objective:    Vitals: BP 118/82 (BP Location: Right Arm)    Pulse 76    Temp (!) 97.3 F (36.3 C) (Tympanic)    Ht 6\' 2"  (1.88 m)    Wt 200 lb 6.4 oz (90.9 kg)    SpO2 96%    BMI 25.73 kg/m   Body mass index is 25.73 kg/m.  Advanced Directives 04/14/2020 04/11/2019 03/28/2018 07/16/2017 09/21/2016  Does Patient Have a Medical Advance Directive? No No No No No  Would patient like information on creating a medical advance directive? No - Patient declined No - Patient declined Yes (MAU/Ambulatory/Procedural Areas - Information given) No - Patient declined -    Tobacco Social History   Tobacco Use  Smoking Status Former Smoker   Packs/day: 1.00   Years: 40.00   Pack years: 40.00   Types: Cigarettes   Quit date: 11/21/2005   Years since quitting: 14.4  Smokeless Tobacco Never Used     Counseling given: Not Answered   Clinical Intake:  Pre-visit preparation completed: Yes  Pain : No/denies pain Pain Score: 0-No pain     Nutritional Status: BMI 25 -29 Overweight Nutritional Risks: None Diabetes: No([place])  How often do you need to have someone help you when you read instructions, pamphlets, or other written materials from your doctor or pharmacy?: 1 - Never  Interpreter Needed?: No  Information entered by :: [facility], LPN  Past Medical History:  Diagnosis Date   Colon cancer ([place])    Hyperlipidemia    Personal history of tobacco use, presenting hazards to health 09/17/2015   Past Surgical History:  Procedure Laterality Date   CATARACT EXTRACTION Bilateral    was done in 2 different months but unsure of which months.    COLECTOMY  2007   S/P right and transverse colectomy with anastomosis of the ileal and  descending colon for a T1   COLON SURGERY     removal of partial colon   HERNIA REPAIR  08/2007   HERNIA REPAIR Right    lower right   RETINAL DETACHMENT SURGERY Right 11/2010   TONSILLECTOMY AND ADENOIDECTOMY  1960   Family History  Problem Relation Age of Onset   Arthritis Mother    Diabetes Mother    Chronic Renal Failure Mother    Cancer Father        bladder   Kidney disease Father    Bladder Cancer Father    Chronic Renal Failure Father    Allergies Sister    Cancer Brother        prostate   Social History   Socioeconomic History   Marital status: Married    Spouse name: Not on file   Number of children: 2   Years of education: Not on file   Highest education level: Some college, no degree  Occupational History   Occupation: retired  Tobacco Use   Smoking status: Former Smoker    Packs/day: 1.00    Years: 40.00    Pack years: 40.00    Types: Cigarettes    Quit date: 11/21/2005    Years since quitting: 14.4   Smokeless tobacco: Never Used  Substance and Sexual Activity   Alcohol use: No    Comment: Quit drinking alcohol 10/16/2005  Drug use: No   Sexual activity: Not on file  Other Topics Concern   Not on file  Social History Narrative   Not on file   Social Determinants of Health   Financial Resource Strain: Low Risk    Difficulty of Paying Living Expenses: Not hard at all  Food Insecurity: No Food Insecurity   Worried About Running Out of Food in the Last Year: Never true   [place] in the Last Year: Never true  Transportation Needs: No Transportation Needs   Lack of Transportation (Medical): No   Lack of Transportation (Non-Medical): No  Physical Activity: Inactive   Days of Exercise per Week: 0 days   Minutes of Exercise per Session: 0 min  Stress: No Stress Concern Present   Feeling of Stress : Not at all  Social Connections: Slightly Isolated   Frequency of Communication with Friends and Family:  More than three times a week   Frequency of Social Gatherings with Friends and Family: More than three times a week   Attends Religious Services: More than 4 times per year   Active Member of [employer] or Organizations: No   Attends Archivist Meetings: Never   Marital Status: Married    Outpatient Encounter Medications as of 04/14/2020  Medication Sig   aspirin-acetaminophen-caffeine (EXCEDRIN MIGRAINE) 250-250-65 MG tablet Take 1 tablet by mouth every 6 (six) hours as needed for headache.   Cholecalciferol (VITAMIN D) 2000 UNITS tablet Take 2,000 Units by mouth daily.    ipratropium (ATROVENT) 0.03 % nasal spray Place 2 sprays into both nostrils every 12 (twelve) hours. (Patient taking differently: Place 2 sprays into both nostrils 2 (two) times daily. )   levothyroxine (SYNTHROID) 50 MCG tablet TAKE 1 TABLET BY MOUTH DAILY   loratadine (CLARITIN) 10 MG tablet TAKE 1 TABLET(10 MG) BY MOUTH DAILY   Omega-3 1000 MG CAPS Take by mouth daily.    tadalafil (CIALIS) 5 MG tablet Take 2 tablets (10 mg total) by mouth daily as needed for erectile dysfunction. (Patient not taking: Reported on 04/14/2020)   No facility-administered encounter medications on file as of 04/14/2020.    Activities of Daily Living In your present state of health, do you have any difficulty performing the following activities: 04/14/2020  Hearing? N  Vision? N  Difficulty concentrating or making decisions? N  Walking or climbing stairs? N  Dressing or bathing? N  Doing errands, shopping? N  Preparing Food and eating ? N  Using the Toilet? N  In the past six months, have you accidently leaked urine? N  Do you have problems with loss of bowel control? N  Managing your Medications? N  Managing your Finances? N  Housekeeping or managing your Housekeeping? N  Some recent data might be hidden    Patient Care Team: [name], MD as PCP - General (Family Medicine) Thelma Comp, MD as Referring  Physician (Gastroenterology) Billey Co, MD as Consulting Physician (Urology)   Assessment:   This is a routine wellness examination for [facility].  Exercise Activities and Dietary recommendations Current Exercise Habits: The patient does not participate in regular exercise at present, Exercise limited by: None identified  Goals     DIET - REDUCE SUGAR INTAKE     Recommend cutting back on sugar and sweets to a couple times a week.      LIFESTYLE - DECREASE FALLS RISK     Recommend to remove any items from the home  that may cause slips or trips.        Fall Risk: Fall Risk  04/14/2020 04/11/2019 03/28/2018 10/03/2017 09/21/2016  Falls in the past year? 1 1 Yes Yes Yes  Comment accidental falls - - - -  Number falls in past yr: 1 1 1 1 1   Comment - - stumble to a fall - -  Injury with Fall? 0 0 No Yes No  Follow up Falls prevention discussed Falls prevention discussed Falls prevention discussed - -    FALL RISK PREVENTION PERTAINING TO THE HOME:  Any stairs in or around the home? No  If so, are there any without handrails? N/A  Home free of loose throw rugs in walkways, pet beds, electrical cords, etc? Yes  Adequate lighting in your home to reduce risk of falls? Yes   ASSISTIVE DEVICES UTILIZED TO PREVENT FALLS:  Life alert? No  Use of a cane, walker or w/c? No  Grab bars in the bathroom? No  Shower chair or bench in shower? No  Elevated toilet seat or a handicapped toilet? Yes   TIMED UP AND GO:  Was the test performed? No .    Depression Screen PHQ 2/9 Scores 04/14/2020 04/11/2019 03/28/2018 10/03/2017  PHQ - 2 Score 0 0 0 0    Cognitive Function     6CIT Screen 04/14/2020 03/28/2018 09/21/2016  What Year? 0 points 0 points 0 points  What month? 0 points 0 points 0 points  What time? 0 points 0 points 0 points  Count back from 20 0 points 0 points 0 points  Months in reverse 0 points 0 points 0 points  Repeat phrase 0 points 0 points 0 points  Total Score 0  0 0    Immunization History  Administered Date(s) Administered   Influenza, High Dose Seasonal PF 09/27/2018   Influenza,inj,Quad PF,6+ Mos 08/06/2013   PFIZER SARS-COV-2 Vaccination 02/14/2020, 03/06/2020   Pneumococcal Conjugate-13 09/11/2015   Pneumococcal Polysaccharide-23 07/17/2012   Tdap 05/02/2007    Qualifies for Shingles Vaccine? Yes . Due for Shingrix. Pt has been advised to call insurance company to determine out of pocket expense. Advised may also receive vaccine at local pharmacy or Health Dept. Verbalized acceptance and understanding.  Tdap: Although this vaccine is not a covered service during a Wellness Exam, does the patient still wish to receive this vaccine today?  No . Advised may receive this vaccine at local pharmacy or Health Dept. Aware to provide a copy of the vaccination record if obtained from local pharmacy or Health Dept. Verbalized acceptance and understanding.  Flu Vaccine: Due fall 2021.  Pneumococcal Vaccine: Completed series  Screening Tests Health Maintenance  Topic Date Due   COLONOSCOPY  02/19/2019   TETANUS/TDAP  04/14/2021 (Originally 05/01/2017)   INFLUENZA VACCINE  06/08/2020   COVID-19 Vaccine  Completed   Hepatitis C Screening  Completed   PNA vac Low Risk Adult  Completed   Cancer Screenings:  Colorectal Screening: Completed 02/18/14. Repeat every 5 years. Pt plans to call and set up apt this year.   Lung Cancer Screening: (Low Dose CT Chest recommended if Age 37-80 years, 30 pack-year currently smoking OR have quit w/in 15years.) does qualify. An Epic message has been sent to Burgess Estelle, RN (Oncology Nurse Navigator) regarding the possible need for this exam. Raquel Sarna will review the patient's chart to determine if the patient truly qualifies for the exam. If the patient qualifies, Raquel Sarna will order the Low Dose CT of the chest  to facilitate the scheduling of this exam.  Additional Screening:  Hepatitis C Screening: Up to  date  Vision Screening: Recommended annual ophthalmology exams for early detection of glaucoma and other disorders of the eye.  Dental Screening: Recommended annual dental exams for proper oral hygiene  Community Resource Referral:  CRR required this visit? No      Plan:  I have personally reviewed and addressed the Medicare Annual Wellness questionnaire and have noted the following in the patients chart:  A. Medical and social history B. Use of alcohol, tobacco or illicit drugs  C. Current medications and supplements D. Functional ability and status E.  Nutritional status F.  Physical activity G. Advance directives H. List of other physicians I.  Hospitalizations, surgeries, and ER visits in previous 12 months J.  [place] such as hearing and vision if needed, cognitive and depression L. Referrals and appointments   In addition, I have reviewed and discussed with patient certain preventive protocols, quality metrics, and best practice recommendations. A written personalized care plan for preventive services as well as general preventive health recommendations were provided to patient.   Glendora Score, [place]  [id number] Nurse Health Advisor   Nurse Notes: Pt plans to schedule a colonoscopy this year. Pt had to r/s last year due to Covid.

## 2020-04-11 NOTE — Progress Notes (Signed)
Complete physical exam  I,April Miller,acting as a scribe for Wilhemena Durie, MD.,have documented all relevant documentation on the behalf of Wilhemena Durie, MD,as directed by  Wilhemena Durie, MD while in the presence of Wilhemena Durie, MD.   Patient: David Trevino   DOB: 07-03-1946   74 y.o. Male  MRN: [id number] Visit Date: 04/14/2020  Today's healthcare provider: Wilhemena Durie, MD   Chief Complaint  Patient presents with  . Follow-up   Subjective    David Trevino is a 74 y.o. male who presents today for a health maintenance and review of medical issues.Marland Kitchen  He reports consuming a general diet. The patient does not participate in regular exercise at present. He generally feels well. He reports sleeping well. He does not have additional problems to discuss today.  HPI  Patient had AWV with NHA today at 1:20 pm.  Past Medical History:  Diagnosis Date  . Colon cancer ([place])   . Hyperlipidemia   . Personal history of tobacco use, presenting hazards to health 09/17/2015   Past Surgical History:  Procedure Laterality Date  . CATARACT EXTRACTION Bilateral    was done in 2 different months but unsure of which months.   . 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 COLON SURGERY     removal of partial colon  . HERNIA REPAIR  08/2007  . HERNIA REPAIR Right    lower right  . RETINAL DETACHMENT SURGERY Right 11/2010  . TONSILLECTOMY AND ADENOIDECTOMY  1960   Social History   Socioeconomic History  . Marital status: Married    Spouse name: Not on file  . Number of children: 2  . Years of education: Not on file  . Highest education level: Some college, no degree  Occupational History  . Occupation: retired  Tobacco Use  . Smoking status: Former Smoker    Packs/day: 1.00    Years: 40.00    Pack years: 40.00    Types: Cigarettes    Quit date: 11/21/2005    Years  since quitting: 14.4  . Smokeless tobacco: Never Used  Substance and Sexual Activity  . Alcohol use: No    Comment: Quit drinking alcohol 10/16/2005  . Drug use: No  . Sexual activity: Not on file  Other Topics Concern  . Not on file  Social History Narrative  . Not on file   Social Determinants of Health   Financial Resource Strain: Low Risk   . Difficulty of Paying Living Expenses: Not hard at all  Food Insecurity: No Food Insecurity  . Worried About Charity fundraiser in the Last Year: Never true  . Ran Out of Food in the Last Year: Never true  Transportation Needs: No Transportation Needs  . Lack of Transportation (Medical): No  . Lack of Transportation (Non-Medical): No  Physical Activity: Inactive  . Days of Exercise per Week: 0 days  . Minutes of Exercise per Session: 0 min  Stress: No Stress Concern Present  . Feeling of Stress : Not at all  Social Connections: Slightly Isolated  . Frequency of Communication with Friends and Family: More than three times a week  . Frequency of Social Gatherings with Friends and Family: More than three times a week  . Attends Religious Services: More than 4 times per year  . Active Member of Clubs or Organizations: No  .  Attends Archivist Meetings: Never  . Marital Status: Married  Human resources officer Violence: Not At Risk  . Fear of Current or Ex-Partner: No  . Emotionally Abused: No  . Physically Abused: No  . Sexually Abused: No   Family Status  Relation Name Status  . Mother  Deceased       cause of death was complications with medications  . Father  Deceased at age 22       cause of death was Urinic poison  . Sister  Alive  . Brother  Deceased   Family History  Problem Relation Age of Onset  . Arthritis Mother   . Diabetes Mother   . Chronic Renal Failure Mother   . Cancer Father        bladder  . Kidney disease Father   . Bladder Cancer Father   . Chronic Renal Failure Father   . Allergies Sister   .  Cancer Brother        prostate   No Known Allergies  Patient Care Team: [name], MD as PCP - General (Family Medicine) Thelma Comp, MD as Referring Physician (Gastroenterology) Billey Co, MD as Consulting Physician (Urology)   Medications: Outpatient Medications Prior to Visit  Medication Sig  . aspirin-acetaminophen-caffeine (EXCEDRIN MIGRAINE) 3400069847 MG tablet Take 1 tablet by mouth every 6 (six) hours as needed for headache.  . Cholecalciferol (VITAMIN D) 2000 UNITS tablet Take 2,000 Units by mouth daily.   Marland Kitchen ipratropium (ATROVENT) 0.03 % nasal spray Place 2 sprays into both nostrils every 12 (twelve) hours. (Patient taking differently: Place 2 sprays into both nostrils 2 (two) times daily. )  . levothyroxine (SYNTHROID) 50 MCG tablet TAKE 1 TABLET BY MOUTH DAILY  . loratadine (CLARITIN) 10 MG tablet TAKE 1 TABLET(10 MG) BY MOUTH DAILY  . Omega-3 1000 MG CAPS Take by mouth daily.   . tadalafil (CIALIS) 5 MG tablet Take 2 tablets (10 mg total) by mouth daily as needed for erectile dysfunction. (Patient not taking: Reported on 04/14/2020)   No facility-administered medications prior to visit.    Review of Systems  Constitutional: Negative.   HENT: Negative.   Eyes: Negative.   Respiratory: Negative.   Cardiovascular: Negative.   Gastrointestinal: Negative.   Endocrine: Negative.   Genitourinary: Negative.   Musculoskeletal: Positive for arthralgias.       Mild knee pain bilaterally.  Allergic/Immunologic: Negative.   Neurological: Negative.   Hematological: Negative.   Psychiatric/Behavioral: Negative.        Objective    There were no vitals taken for this visit.   BP 118/82 (BP Location: Right Arm)  Pulse 76  Temp 97.3 F (36.3 C) (Tympanic)  Ht 6\' 2"  (1.88 m)  Wt 200 lb 6.4 oz (90.9 kg)  SpO2 96%  BMI 25.73 kg/m  BSA 2.18 m  Pain [place] 0-No pain    Physical Exam Vitals and nursing note reviewed. Exam conducted with a chaperone  present.  Constitutional:      Appearance: Normal appearance. He is normal weight.  HENT:     Right Ear: Tympanic membrane normal.     Left Ear: Tympanic membrane normal.     Nose: Nose normal.     Mouth/Throat:     Mouth: Mucous membranes are moist.  Eyes:     General: No scleral icterus.    Conjunctiva/sclera: Conjunctivae normal.  Cardiovascular:     Rate and Rhythm: Normal rate and regular rhythm.  Pulses: Normal pulses.     Heart sounds: Normal heart sounds.  Pulmonary:     Effort: Pulmonary effort is normal.     Breath sounds: Normal breath sounds.  Abdominal:     General: Bowel sounds are normal.     Palpations: Abdomen is soft.  Musculoskeletal:        General: No swelling.     Cervical back: Normal range of motion and neck supple.     Comments: Area of discomfort appears to be the joint line and some over the top of the patella on the right knee.  Skin:    General: Skin is warm and dry.  Neurological:     General: No focal deficit present.     Mental Status: He is alert and oriented to person, place, and time.  Psychiatric:        Mood and Affect: Mood normal.        Behavior: Behavior normal.        Thought Content: Thought content normal.        Judgment: Judgment normal.       Depression Screen  PHQ 2/9 Scores 04/14/2020 04/11/2019 03/28/2018  PHQ - 2 Score 0 0 0    No results found for any visits on 04/14/20.  Assessment & Plan    Routine Health Maintenance and Physical Exam  Exercise Activities and Dietary recommendations Goals    . DIET - REDUCE SUGAR INTAKE     Recommend cutting back on sugar and sweets to a couple times a week.     Marland Kitchen LIFESTYLE - DECREASE FALLS RISK     Recommend to remove any items from the home that may cause slips or trips.        Immunization History  Administered Date(s) Administered  . Influenza, High Dose Seasonal PF 09/27/2018  . Influenza,inj,Quad PF,6+ Mos 08/06/2013  . PFIZER SARS-COV-2 Vaccination 02/14/2020,  03/06/2020  . Pneumococcal Conjugate-13 09/11/2015  . Pneumococcal Polysaccharide-23 07/17/2012  . Tdap 05/02/2007    Health Maintenance  Topic Date Due  . COLONOSCOPY  02/19/2019  . TETANUS/TDAP  04/14/2021 (Originally 05/01/2017)  . INFLUENZA VACCINE  06/08/2020  . COVID-19 Vaccine  Completed  . Hepatitis C Screening  Completed  . PNA vac Low Risk Adult  Completed    Discussed health benefits of physical activity, and encouraged him to engage in regular exercise appropriate for his age and condition.  1. Hyperlipidemia, unspecified hyperlipidemia type  - CBC w/Diff/Platelet - Comprehensive Metabolic Panel (CMET) - TSH - Lipid panel - Hemoglobin A1C  2. Adult hypothyroidism  - CBC w/Diff/Platelet - Comprehensive Metabolic Panel (CMET) - TSH - Lipid panel - Hemoglobin A1C  3. Borderline diabetes Check A1c. - CBC w/Diff/Platelet - Comprehensive Metabolic Panel (CMET) - TSH - Lipid panel - Hemoglobin A1C  4. Malignant neoplasm of colon, unspecified part of colon [facility]) Ms. last year 5-year follow-up because of Covid. Scheduled for later this summer for colonoscopy.  5. Avitaminosis D   6. Personal history of tobacco use, presenting hazards to health Patient has now quit. Scheduled for screening CT scan.   No follow-ups on file.     I, Wilhemena Durie, MD, have reviewed all documentation for this visit. The documentation on 04/15/20 for the exam, diagnosis, procedures, and orders are all accurate and complete.    Ulyses Panico Cranford Mon, MD  [facility] [phone] (phone) [phone] (fax)  [place]

## 2020-04-14 ENCOUNTER — Other Ambulatory Visit: Payer: Self-pay

## 2020-04-14 ENCOUNTER — Ambulatory Visit (INDEPENDENT_AMBULATORY_CARE_PROVIDER_SITE_OTHER): Payer: Medicare Other

## 2020-04-14 ENCOUNTER — Ambulatory Visit (INDEPENDENT_AMBULATORY_CARE_PROVIDER_SITE_OTHER): Payer: Medicare Other | Admitting: Family Medicine

## 2020-04-14 VITALS — BP 118/82 | HR 76 | Temp 97.3°F | Ht 74.0 in | Wt 200.4 lb

## 2020-04-14 DIAGNOSIS — R7303 Prediabetes: Secondary | ICD-10-CM

## 2020-04-14 DIAGNOSIS — Z0000 Encounter for general adult medical examination without abnormal findings: Secondary | ICD-10-CM

## 2020-04-14 DIAGNOSIS — E785 Hyperlipidemia, unspecified: Secondary | ICD-10-CM | POA: Diagnosis not present

## 2020-04-14 DIAGNOSIS — C189 Malignant neoplasm of colon, unspecified: Secondary | ICD-10-CM

## 2020-04-14 DIAGNOSIS — E039 Hypothyroidism, unspecified: Secondary | ICD-10-CM

## 2020-04-14 DIAGNOSIS — Z87891 Personal history of nicotine dependence: Secondary | ICD-10-CM

## 2020-04-14 DIAGNOSIS — E559 Vitamin D deficiency, unspecified: Secondary | ICD-10-CM

## 2020-04-14 NOTE — Patient Instructions (Signed)
[name] , Thank you for taking time to come for your Medicare Wellness Visit. I appreciate your ongoing commitment to your health goals. Please review the following plan we discussed and let me know if I can assist you in the future.   Screening recommendations/referrals: Colonoscopy: Currently due. Pt to schedule apt for colonoscopy this year.  Recommended yearly ophthalmology/optometry visit for glaucoma screening and checkup Recommended yearly dental visit for hygiene and checkup  Vaccinations: Influenza vaccine: Due fall 2021 Pneumococcal vaccine: Completed series Tdap vaccine: Pt declines today.  Shingles vaccine: Pt declines today.     Advanced directives: Advance directive discussed with you today. Even though you declined this today please call our office should you change your mind and we can give you the proper paperwork for you to fill out.  Conditions/risks identified: Fall risk prevention discussed today. Recommend to continue to cut back on sugar intake to a couple times a week versus daily.   Next appointment: 2:00 PM today with Dr Rosanna Randy   Preventive Care 73 Years and Older, Male Preventive care refers to lifestyle choices and visits with your health care provider that can promote health and wellness. What does preventive care include?  A yearly physical exam. This is also called an annual well check.  Dental exams once or twice a year.  Routine eye exams. Ask your health care provider how often you should have your eyes checked.  Personal lifestyle choices, including:  Daily care of your teeth and gums.  Regular physical activity.  Eating a healthy diet.  Avoiding tobacco and drug use.  Limiting alcohol use.  Practicing safe sex.  Taking low doses of aspirin every day.  Taking vitamin and mineral supplements as recommended by your health care provider. What happens during an annual well check? The services and screenings done by your health care  provider during your annual well check will depend on your age, overall health, lifestyle risk factors, and family history of disease. Counseling  Your health care provider may ask you questions about your:  Alcohol use.  Tobacco use.  Drug use.  Emotional well-being.  Home and relationship well-being.  Sexual activity.  Eating habits.  History of falls.  Memory and ability to understand (cognition).  Work and work Statistician. Screening  You may have the following tests or measurements:  Height, weight, and BMI.  Blood pressure.  Lipid and cholesterol levels. These may be checked every 5 years, or more frequently if you are over 36 years old.  Skin check.  Lung cancer screening. You may have this screening every year starting at age 43 if you have a 30-pack-year history of smoking and currently smoke or have quit within the past 15 years.  Fecal occult blood test (FOBT) of the stool. You may have this test every year starting at age 64.  Flexible sigmoidoscopy or colonoscopy. You may have a sigmoidoscopy every 5 years or a colonoscopy every 10 years starting at age 67.  Prostate cancer screening. Recommendations will vary depending on your family history and other risks.  Hepatitis C blood test.  Hepatitis B blood test.  Sexually transmitted disease (STD) testing.  Diabetes screening. This is done by checking your blood sugar (glucose) after you have not eaten for a while (fasting). You may have this done every 1-3 years.  Abdominal aortic aneurysm (AAA) screening. You may need this if you are a current or former smoker.  Osteoporosis. You may be screened starting at age 74 if you  are at high risk. Talk with your health care provider about your test results, treatment options, and if necessary, the need for more tests. Vaccines  Your health care provider may recommend certain vaccines, such as:  Influenza vaccine. This is recommended every year.  Tetanus,  diphtheria, and acellular pertussis (Tdap, Td) vaccine. You may need a Td booster every 10 years.  Zoster vaccine. You may need this after age 38.  Pneumococcal 13-valent conjugate (PCV13) vaccine. One dose is recommended after age 3.  Pneumococcal polysaccharide (PPSV23) vaccine. One dose is recommended after age 79. Talk to your health care provider about which screenings and vaccines you need and how often you need them. This information is not intended to replace advice given to you by your health care provider. Make sure you discuss any questions you have with your health care provider. Document Released: 11/21/2015 Document Revised: 07/14/2016 Document Reviewed: 08/26/2015 Elsevier Interactive Patient Education  2017 [place] Prevention in the Home Falls can cause injuries. They can happen to people of all ages. There are many things you can do to make your home safe and to help prevent falls. What can I do on the outside of my home?  Regularly fix the edges of walkways and driveways and fix any cracks.  Remove anything that might make you trip as you walk through a door, such as a raised step or threshold.  Trim any bushes or trees on the path to your home.  Use bright outdoor lighting.  Clear any walking paths of anything that might make someone trip, such as rocks or tools.  Regularly check to see if handrails are loose or broken. Make sure that both sides of any steps have handrails.  Any raised decks and porches should have guardrails on the edges.  Have any leaves, snow, or ice cleared regularly.  Use sand or salt on walking paths during winter.  Clean up any spills in your garage right away. This includes oil or grease spills. What can I do in the bathroom?  Use night lights.  Install grab bars by the toilet and in the tub and shower. Do not use towel bars as grab bars.  Use non-skid mats or decals in the tub or shower.  If you need to sit down in  the shower, use a plastic, non-slip stool.  Keep the floor dry. Clean up any water that spills on the floor as soon as it happens.  Remove soap buildup in the tub or shower regularly.  Attach bath mats securely with double-sided non-slip rug tape.  Do not have throw rugs and other things on the floor that can make you trip. What can I do in the bedroom?  Use night lights.  Make sure that you have a light by your bed that is easy to reach.  Do not use any sheets or blankets that are too big for your bed. They should not hang down onto the floor.  Have a firm chair that has side arms. You can use this for support while you get dressed.  Do not have throw rugs and other things on the floor that can make you trip. What can I do in the kitchen?  Clean up any spills right away.  Avoid walking on wet floors.  Keep items that you use a lot in easy-to-reach places.  If you need to reach something above you, use a strong step stool that has a grab bar.  Keep electrical cords out  of the way.  Do not use floor polish or wax that makes floors slippery. If you must use wax, use non-skid floor wax.  Do not have throw rugs and other things on the floor that can make you trip. What can I do with my stairs?  Do not leave any items on the stairs.  Make sure that there are handrails on both sides of the stairs and use them. Fix handrails that are broken or loose. Make sure that handrails are as long as the stairways.  Check any carpeting to make sure that it is firmly attached to the stairs. Fix any carpet that is loose or worn.  Avoid having throw rugs at the top or bottom of the stairs. If you do have throw rugs, attach them to the floor with carpet tape.  Make sure that you have a light switch at the top of the stairs and the bottom of the stairs. If you do not have them, ask someone to add them for you. What else can I do to help prevent falls?  Wear shoes that:  Do not have high  heels.  Have rubber bottoms.  Are comfortable and fit you well.  Are closed at the toe. Do not wear sandals.  If you use a stepladder:  Make sure that it is fully opened. Do not climb a closed stepladder.  Make sure that both sides of the stepladder are locked into place.  Ask someone to hold it for you, if possible.  Clearly mark and make sure that you can see:  Any grab bars or handrails.  First and last steps.  Where the edge of each step is.  Use tools that help you move around (mobility aids) if they are needed. These include:  Canes.  Walkers.  Scooters.  Crutches.  Turn on the lights when you go into a dark area. Replace any light bulbs as soon as they burn out.  Set up your furniture so you have a clear path. Avoid moving your furniture around.  If any of your floors are uneven, fix them.  If there are any pets around you, be aware of where they are.  Review your medicines with your doctor. Some medicines can make you feel dizzy. This can increase your chance of falling. Ask your doctor what other things that you can do to help prevent falls. This information is not intended to replace advice given to you by your health care provider. Make sure you discuss any questions you have with your health care provider. Document Released: 08/21/2009 Document Revised: 04/01/2016 Document Reviewed: 11/29/2014 Elsevier Interactive Patient Education  2017 [employer].

## 2020-04-14 NOTE — Patient Instructions (Signed)
Try over-the-counter Robitussin in the am and in the pm.

## 2020-04-15 DIAGNOSIS — R7303 Prediabetes: Secondary | ICD-10-CM | POA: Diagnosis not present

## 2020-04-15 DIAGNOSIS — E039 Hypothyroidism, unspecified: Secondary | ICD-10-CM | POA: Diagnosis not present

## 2020-04-15 DIAGNOSIS — E785 Hyperlipidemia, unspecified: Secondary | ICD-10-CM | POA: Diagnosis not present

## 2020-04-16 LAB — COMPREHENSIVE METABOLIC PANEL
ALT: 16 IU/L (ref 0–44)
AST: 16 IU/L (ref 0–40)
Albumin/Globulin Ratio: 1.5 (ref 1.2–2.2)
Albumin: 4.3 g/dL (ref 3.7–4.7)
Alkaline Phosphatase: 90 IU/L (ref 48–121)
BUN/Creatinine Ratio: 18 (ref 10–24)
BUN: 17 mg/dL (ref 8–27)
Bilirubin Total: 0.4 mg/dL (ref 0.0–1.2)
CO2: 20 mmol/L (ref 20–29)
Calcium: 8.8 mg/dL (ref 8.6–10.2)
Chloride: 105 mmol/L (ref 96–106)
Creatinine, Ser: 0.95 mg/dL (ref 0.76–1.27)
GFR calc Af Amer: 92 mL/min/{1.73_m2} (ref >59)
GFR calc non Af Amer: 80 mL/min/{1.73_m2} (ref >59)
Globulin, Total: 2.9 g/dL (ref 1.5–4.5)
Glucose: 119 mg/dL — ABNORMAL HIGH (ref 65–99)
Potassium: 4.4 mmol/L (ref 3.5–5.2)
Sodium: 143 mmol/L (ref 134–144)
Total Protein: 7.2 g/dL (ref 6.0–8.5)

## 2020-04-16 LAB — LIPID PANEL
Chol/HDL Ratio: 4.7 ratio (ref 0.0–5.0)
Cholesterol, Total: 208 mg/dL — ABNORMAL HIGH (ref 100–199)
HDL: 44 mg/dL (ref >39)
LDL Chol Calc (NIH): 138 mg/dL — ABNORMAL HIGH (ref 0–99)
Triglycerides: 145 mg/dL (ref 0–149)
VLDL Cholesterol Cal: 26 mg/dL (ref 5–40)

## 2020-04-16 LAB — CBC WITH DIFFERENTIAL/PLATELET
Basophils Absolute: 0.1 10*3/uL (ref 0.0–0.2)
Basos: 1 %
EOS (ABSOLUTE): 0.1 10*3/uL (ref 0.0–0.4)
Eos: 3 %
Hematocrit: 47 % (ref 37.5–51.0)
Hemoglobin: 15.8 g/dL (ref 13.0–17.7)
Immature Grans (Abs): 0 10*3/uL (ref 0.0–0.1)
Immature Granulocytes: 0 %
Lymphocytes Absolute: 2.1 10*3/uL (ref 0.7–3.1)
Lymphs: 37 %
MCH: 30.4 pg (ref 26.6–33.0)
MCHC: 33.6 g/dL (ref 31.5–35.7)
MCV: 91 fL (ref 79–97)
Monocytes Absolute: 0.5 10*3/uL (ref 0.1–0.9)
Monocytes: 8 %
Neutrophils Absolute: 2.9 10*3/uL (ref 1.4–7.0)
Neutrophils: 51 %
Platelets: 260 10*3/uL (ref 150–450)
RBC: 5.19 x10E6/uL (ref 4.14–5.80)
RDW: 14.3 % (ref 11.6–15.4)
WBC: 5.6 10*3/uL (ref 3.4–10.8)

## 2020-04-16 LAB — HEMOGLOBIN A1C
Est. average glucose Bld gHb Est-mCnc: 123 mg/dL
Hgb A1c MFr Bld: 5.9 % — ABNORMAL HIGH (ref 4.8–5.6)

## 2020-04-16 LAB — TSH: TSH: 1.73 u[IU]/mL (ref 0.450–4.500)

## 2020-04-25 ENCOUNTER — Encounter: Payer: Self-pay | Admitting: *Deleted

## 2020-04-25 ENCOUNTER — Telehealth: Payer: Self-pay | Admitting: *Deleted

## 2020-04-25 DIAGNOSIS — Z87891 Personal history of nicotine dependence: Secondary | ICD-10-CM

## 2020-04-25 DIAGNOSIS — Z122 Encounter for screening for malignant neoplasm of respiratory organs: Secondary | ICD-10-CM

## 2020-04-25 NOTE — Telephone Encounter (Signed)
Patient has been notified that annual lung cancer screening low dose CT scan is due currently or will be in near future. Confirmed that patient is within the age range of 55-77, and asymptomatic, (no signs or symptoms of lung cancer). Patient denies illness that would prevent curative treatment for lung cancer if found. Verified smoking history, (former, quit 2007, 45 pack year). The shared decision making visit was done 09/19/15. Patient is agreeable for CT scan being scheduled.

## 2020-04-25 NOTE — Telephone Encounter (Signed)
Received referral for low dose lung cancer screening CT scan. Message left at phone number listed in EMR for patient to call me back to facilitate scheduling scan.  

## 2020-04-25 NOTE — Addendum Note (Signed)
Addended by: Lieutenant Diego on: 04/25/2020 01:52 PM   Modules accepted: Orders

## 2020-05-02 ENCOUNTER — Other Ambulatory Visit: Payer: Self-pay

## 2020-05-02 ENCOUNTER — Ambulatory Visit
Admission: RE | Admit: 2020-05-02 | Discharge: 2020-05-02 | Disposition: A | Discharge status: Home / Self Care | Payer: Medicare Other | Source: Ambulatory Visit | Attending: Oncology | Admitting: Oncology

## 2020-05-02 DIAGNOSIS — Z122 Encounter for screening for malignant neoplasm of respiratory organs: Secondary | ICD-10-CM | POA: Diagnosis present

## 2020-05-02 DIAGNOSIS — Z87891 Personal history of nicotine dependence: Secondary | ICD-10-CM | POA: Diagnosis not present

## 2020-05-07 ENCOUNTER — Encounter: Payer: Self-pay | Admitting: *Deleted

## 2020-06-30 ENCOUNTER — Other Ambulatory Visit: Payer: Self-pay | Admitting: Family Medicine

## 2020-06-30 DIAGNOSIS — R0982 Postnasal drip: Secondary | ICD-10-CM

## 2020-06-30 DIAGNOSIS — E039 Hypothyroidism, unspecified: Secondary | ICD-10-CM

## 2020-10-01 ENCOUNTER — Other Ambulatory Visit: Payer: Self-pay | Admitting: Family Medicine

## 2020-10-01 DIAGNOSIS — E039 Hypothyroidism, unspecified: Secondary | ICD-10-CM

## 2020-10-01 DIAGNOSIS — R0982 Postnasal drip: Secondary | ICD-10-CM

## 2021-04-20 ENCOUNTER — Other Ambulatory Visit: Payer: Self-pay | Admitting: Family Medicine

## 2021-04-20 DIAGNOSIS — E039 Hypothyroidism, unspecified: Secondary | ICD-10-CM

## 2021-04-20 NOTE — Telephone Encounter (Signed)
  Notes to clinic: Patient has upcoming appt tomorrow  Review for refill   Requested Prescriptions  Pending Prescriptions Disp Refills   levothyroxine (SYNTHROID) 50 MCG tablet [Pharmacy Med Name: LEVOTHYROXINE 0.05MG  (50MCG) TAB] 90 tablet 1    Sig: TAKE 1 TABLET BY MOUTH DAILY      Endocrinology:  Hypothyroid Agents Failed - 04/20/2021  3:39 AM      Failed - TSH needs to be rechecked within 3 months after an abnormal result. Refill until TSH is due.      Failed - TSH in normal range and within 360 days    TSH  Date Value Ref Range Status  04/15/2020 1.730 0.450 - 4.500 uIU/mL Final          Failed - Valid encounter within last 12 months    Recent Outpatient Visits           1 year ago Hyperlipidemia, unspecified hyperlipidemia type   [facility] [name], MD   2 years ago Adult hypothyroidism   [facility] [name], MD   2 years ago Adult hypothyroidism   [facility] [name], MD   2 years ago Acute cystitis with hematuria   [facility] [name], MD   2 years ago Furuncle of buttock   [facility] [place], Vickki Muff, [place]

## 2021-04-21 ENCOUNTER — Other Ambulatory Visit: Payer: Self-pay

## 2021-04-21 ENCOUNTER — Other Ambulatory Visit: Payer: Self-pay | Admitting: Family Medicine

## 2021-04-21 ENCOUNTER — Ambulatory Visit (INDEPENDENT_AMBULATORY_CARE_PROVIDER_SITE_OTHER): Payer: Medicare Other | Admitting: Family Medicine

## 2021-04-21 VITALS — BP 143/88 | HR 79 | Temp 98.4°F | Resp 16 | Ht 74.0 in | Wt 200.0 lb

## 2021-04-21 DIAGNOSIS — Z0000 Encounter for general adult medical examination without abnormal findings: Secondary | ICD-10-CM

## 2021-04-21 DIAGNOSIS — R7303 Prediabetes: Secondary | ICD-10-CM | POA: Diagnosis not present

## 2021-04-21 DIAGNOSIS — C189 Malignant neoplasm of colon, unspecified: Secondary | ICD-10-CM

## 2021-04-21 DIAGNOSIS — E559 Vitamin D deficiency, unspecified: Secondary | ICD-10-CM

## 2021-04-21 DIAGNOSIS — R0982 Postnasal drip: Secondary | ICD-10-CM | POA: Diagnosis not present

## 2021-04-21 DIAGNOSIS — M25561 Pain in right knee: Secondary | ICD-10-CM | POA: Diagnosis not present

## 2021-04-21 DIAGNOSIS — E039 Hypothyroidism, unspecified: Secondary | ICD-10-CM | POA: Diagnosis not present

## 2021-04-21 DIAGNOSIS — E785 Hyperlipidemia, unspecified: Secondary | ICD-10-CM

## 2021-04-21 MED ORDER — LORATADINE 10 MG PO TABS: ORAL_TABLET | ORAL | 1 refills | Status: DC

## 2021-04-21 MED ORDER — IPRATROPIUM BROMIDE 0.03 % NA SOLN: 2.0000 | Freq: Two times a day (BID) | NASAL | 12 refills | Status: AC

## 2021-04-21 NOTE — Patient Instructions (Signed)
Try OTC Voltaren Gel for knee and foot pain as needed.

## 2021-04-21 NOTE — Progress Notes (Signed)
Annual Wellness Visit     Patient: David Trevino, Male    DOB: 07/29/1946, 75 y.o.   MRN: [id number] Visit Date: 04/21/2021  Today's Provider: Wilhemena Durie, MD   Chief Complaint  Patient presents with   Annual Exam   Subjective    David Trevino is a 75 y.o. male who presents today for his Annual Wellness Visit. He reports consuming a general diet. Home exercise routine includes walking 1 hrs per day. He generally feels well. He reports sleeping well. He does not have additional problems to discuss today.  Overall he has no significant complaints.  Has had some right knee discomfort and right foot pain recently.  It is time for follow-up colonoscopy at [facility]. He has a spot in his left axilla he wishes to have checked.    Medications: Outpatient Medications Prior to Visit  Medication Sig   aspirin-acetaminophen-caffeine (EXCEDRIN MIGRAINE) 250-250-65 MG tablet Take 1 tablet by mouth every 6 (six) hours as needed for headache.   Cholecalciferol (VITAMIN D) 2000 UNITS tablet Take 2,000 Units by mouth daily.    levothyroxine (SYNTHROID) 50 MCG tablet TAKE 1 TABLET BY MOUTH DAILY   Omega-3 1000 MG CAPS Take by mouth daily.    [DISCONTINUED] ipratropium (ATROVENT) 0.03 % nasal spray Place 2 sprays into both nostrils every 12 (twelve) hours. (Patient taking differently: Place 2 sprays into both nostrils 2 (two) times daily.)   [DISCONTINUED] loratadine (CLARITIN) 10 MG tablet TAKE 1 TABLET(10 MG) BY MOUTH DAILY   tadalafil (CIALIS) 5 MG tablet Take 2 tablets (10 mg total) by mouth daily as needed for erectile dysfunction. (Patient not taking: No sig reported)   No facility-administered medications prior to visit.    No Known Allergies  Patient Care Team: [name], MD as PCP - General (Family Medicine) Thelma Comp, MD as Referring Physician (Gastroenterology) Billey Co, MD as Consulting Physician (Urology)  Review of Systems  All  other systems reviewed and are negative.       Objective    Vitals: BP (!) 143/88   Pulse 79   Temp 98.4 F (36.9 C)   Resp 16   Ht _0  (1.88 m)   Wt 200 lb (90.7 kg)   BMI 25.68 kg/m  BP Readings from Last 3 Encounters:  04/21/21 (!) 143/88  04/14/20 118/82  01/30/20 136/87   Wt Readings from Last 3 Encounters:  04/21/21 200 lb (90.7 kg)  05/02/20 200 lb (90.7 kg)  04/14/20 200 lb 6.4 oz (90.9 kg)      Physical Exam Vitals reviewed.  Constitutional:      Appearance: He is well-developed.  HENT:     Head: Normocephalic.     Right Ear: External ear normal.     Left Ear: External ear normal.     Nose: Nose normal.  Eyes:     General: No scleral icterus.    Conjunctiva/sclera: Conjunctivae normal.  Neck:     Thyroid: No thyromegaly.  Cardiovascular:     Rate and Rhythm: Normal rate and regular rhythm.     Heart sounds: Normal heart sounds.  Pulmonary:     Effort: Pulmonary effort is normal.  Abdominal:     General: Bowel sounds are normal.     Palpations: Abdomen is soft.  Genitourinary:    Penis: Normal.      Testes: Normal.  Musculoskeletal:        General: No tenderness or deformity.  Skin:    General: Skin is warm and dry.  Neurological:     General: No focal deficit present.     Mental Status: He is alert and oriented to person, place, and time.     Cranial Nerves: No cranial nerve deficit.     Motor: No abnormal muscle tone.     Coordination: Coordination normal.  Psychiatric:        Mood and Affect: Mood normal.        Behavior: Behavior normal.        Thought Content: Thought content normal.        Judgment: Judgment normal.     Most recent functional status assessment: No flowsheet data found. Most recent fall risk assessment: Fall Risk  04/14/2020  Falls in the past year? 1  Comment accidental falls  Number falls in past yr: 1  Comment -  Injury with Fall? 0  Follow up Falls prevention discussed    Most recent depression  screenings: PHQ 2/9 Scores 04/14/2020 04/11/2019  PHQ - 2 Score 0 0   Most recent cognitive screening: 6CIT Screen 04/14/2020  What Year? 0 points  What month? 0 points  What time? 0 points  Count back from 20 0 points  Months in reverse 0 points  Repeat phrase 0 points  Total Score 0   Most recent Audit-C alcohol use screening Alcohol Use Disorder Test (AUDIT) 04/14/2020  1. How often do you have a drink containing alcohol? 0  2. How many drinks containing alcohol do you have on a typical day when you are drinking? 0  3. How often do you have six or more drinks on one occasion? 0  AUDIT-C Score 0  Alcohol Brief Interventions/Follow-up AUDIT Score <7 follow-up not indicated   A score of 3 or more in women, and 4 or more in men indicates increased risk for alcohol abuse, EXCEPT if all of the points are from question 1   Results for orders placed or performed in visit on 04/21/21  CBC with Differential/Platelet  Result Value Ref Range   WBC 6.4 3.4 - 10.8 x10E3/uL   RBC 5.24 4.14 - 5.80 x10E6/uL   Hemoglobin 15.9 13.0 - 17.7 g/dL   Hematocrit 47.0 37.5 - 51.0 %   MCV 90 79 - 97 fL   MCH 30.3 26.6 - 33.0 pg   MCHC 33.8 31.5 - 35.7 g/dL   RDW 15.1 11.6 - 15.4 %   Platelets 269 150 - 450 x10E3/uL   Neutrophils 52 Not Estab. %   Lymphs 36 Not Estab. %   Monocytes 9 Not Estab. %   Eos 2 Not Estab. %   Basos 1 Not Estab. %   Neutrophils Absolute 3.3 1.4 - 7.0 x10E3/uL   Lymphocytes Absolute 2.3 0.7 - 3.1 x10E3/uL   Monocytes Absolute 0.6 0.1 - 0.9 x10E3/uL   EOS (ABSOLUTE) 0.1 0.0 - 0.4 x10E3/uL   Basophils Absolute 0.1 0.0 - 0.2 x10E3/uL   Immature Granulocytes 0 Not Estab. %   Immature Grans (Abs) 0.0 0.0 - 0.1 x10E3/uL  Comprehensive metabolic panel  Result Value Ref Range   Glucose 120 (H) 65 - 99 mg/dL   BUN 16 8 - 27 mg/dL   Creatinine, Ser 0.93 0.76 - 1.27 mg/dL   eGFR 86 >59 mL/min/1.73   BUN/Creatinine Ratio 17 10 - 24   Sodium 138 134 - 144 mmol/L   Potassium 4.3 3.5  - 5.2 mmol/L   Chloride 103 96 - 106 mmol/L  CO2 19 (L) 20 - 29 mmol/L   Calcium 9.0 8.6 - 10.2 mg/dL   Total Protein 7.6 6.0 - 8.5 g/dL   Albumin 4.7 3.7 - 4.7 g/dL   Globulin, Total 2.9 1.5 - 4.5 g/dL   Albumin/Globulin Ratio 1.6 1.2 - 2.2   Bilirubin Total 0.6 0.0 - 1.2 mg/dL   Alkaline Phosphatase 88 44 - 121 IU/L   AST 18 0 - 40 IU/L   ALT 20 0 - 44 IU/L  Lipid panel  Result Value Ref Range   Cholesterol, Total 201 (H) 100 - 199 mg/dL   Triglycerides 237 (H) 0 - 149 mg/dL   HDL 36 (L) >39 mg/dL   VLDL Cholesterol Cal 42 (H) 5 - 40 mg/dL   LDL Chol Calc (NIH) 123 (H) 0 - 99 mg/dL   Chol/HDL Ratio 5.6 (H) 0.0 - 5.0 ratio  Hemoglobin A1c  Result Value Ref Range   Hgb A1c MFr Bld 6.1 (H) 4.8 - 5.6 %   Est. average glucose Bld gHb Est-mCnc 128 mg/dL  TSH  Result Value Ref Range   TSH 1.980 0.450 - 4.500 uIU/mL    Assessment & Plan     Annual wellness visit done today including the all of the following: Reviewed patient's Family Medical History Reviewed and updated list of patient's medical providers Assessment of cognitive impairment was done Assessed patient's functional ability Established a written schedule for health screening services Health Risk Assessent Completed and Reviewed  Exercise Activities and Dietary recommendations  Goals      DIET - REDUCE SUGAR INTAKE     Recommend cutting back on sugar and sweets to a couple times a week.       LIFESTYLE - DECREASE FALLS RISK     Recommend to remove any items from the home that may cause slips or trips.          Immunization History  Administered Date(s) Administered   Influenza, High Dose Seasonal PF 09/27/2018   Influenza,inj,Quad PF,6+ Mos 08/06/2013   PFIZER(Purple Top)SARS-COV-2 Vaccination 02/14/2020, 03/06/2020   Pneumococcal Conjugate-13 09/11/2015   Pneumococcal Polysaccharide-23 07/17/2012   Tdap 05/02/2007    Health Maintenance  Topic Date Due   Zoster Vaccines- Shingrix (1 of 2) Never  done   TETANUS/TDAP  05/01/2017   COLONOSCOPY (Pts 45-75yrs Insurance coverage will need to be confirmed)  02/19/2019   COVID-19 Vaccine (3 - Pfizer risk series) 04/03/2020   INFLUENZA VACCINE  06/08/2021   Hepatitis C Screening  Completed   PNA vac Low Risk Adult  Completed   HPV VACCINES  Aged Out     Discussed health benefits of physical activity, and encouraged him to engage in regular exercise appropriate for his age and condition.    1. Encounter for Medicare annual wellness exam  - Lipid panel - TSH - CBC w/Diff/Platelet - Comprehensive Metabolic Panel (CMET) - Hemoglobin A1c  2. Hyperlipidemia, unspecified hyperlipidemia type - Lipid panel - TSH - CBC w/Diff/Platelet - Comprehensive Metabolic Panel (CMET) - Hemoglobin A1c  3. Adult hypothyroidism  - Lipid panel - TSH - CBC w/Diff/Platelet - Comprehensive Metabolic Panel (CMET) - Hemoglobin A1c  4. Borderline diabetes Check A1c.  Diabetes and exercise and keeping weight down as discussed - Lipid panel - TSH - CBC w/Diff/Platelet - Comprehensive Metabolic Panel (CMET) - Hemoglobin A1c  5. Post-nasal drip Treat with Claritin and Atrovent - loratadine (CLARITIN) 10 MG tablet; TAKE 1 TABLET(10 MG) BY MOUTH DAILY  Dispense: 90 tablet; Refill: 1 -  ipratropium (ATROVENT) 0.03 % nasal spray; Place 2 sprays into both nostrils every 12 (twelve) hours.  Dispense: 30 mL; Refill: 12  6. Arthralgia of right knee Topical Voltaren gel for knee and ankle pain  7. Malignant neoplasm of colon, unspecified part of colon ([place]) Followed at [facility]  8. Avitaminosis D     Return in about 6 months (around 10/21/2021).     I, Wilhemena Durie, MD, have reviewed all documentation for this visit. The documentation on 04/24/21 for the exam, diagnosis, procedures, and orders are all accurate and complete.    Constantine Ruddick Cranford Mon, MD  [facility] [phone] (phone) [phone] (fax)  [place]

## 2021-04-22 LAB — CBC WITH DIFFERENTIAL/PLATELET
Basophils Absolute: 0.1 10*3/uL (ref 0.0–0.2)
Basos: 1 %
EOS (ABSOLUTE): 0.1 10*3/uL (ref 0.0–0.4)
Eos: 2 %
Hematocrit: 47 % (ref 37.5–51.0)
Hemoglobin: 15.9 g/dL (ref 13.0–17.7)
Immature Grans (Abs): 0 10*3/uL (ref 0.0–0.1)
Immature Granulocytes: 0 %
Lymphocytes Absolute: 2.3 10*3/uL (ref 0.7–3.1)
Lymphs: 36 %
MCH: 30.3 pg (ref 26.6–33.0)
MCHC: 33.8 g/dL (ref 31.5–35.7)
MCV: 90 fL (ref 79–97)
Monocytes Absolute: 0.6 10*3/uL (ref 0.1–0.9)
Monocytes: 9 %
Neutrophils Absolute: 3.3 10*3/uL (ref 1.4–7.0)
Neutrophils: 52 %
Platelets: 269 10*3/uL (ref 150–450)
RBC: 5.24 x10E6/uL (ref 4.14–5.80)
RDW: 15.1 % (ref 11.6–15.4)
WBC: 6.4 10*3/uL (ref 3.4–10.8)

## 2021-04-22 LAB — COMPREHENSIVE METABOLIC PANEL
ALT: 20 IU/L (ref 0–44)
AST: 18 IU/L (ref 0–40)
Albumin/Globulin Ratio: 1.6 (ref 1.2–2.2)
Albumin: 4.7 g/dL (ref 3.7–4.7)
Alkaline Phosphatase: 88 IU/L (ref 44–121)
BUN/Creatinine Ratio: 17 (ref 10–24)
BUN: 16 mg/dL (ref 8–27)
Bilirubin Total: 0.6 mg/dL (ref 0.0–1.2)
CO2: 19 mmol/L — ABNORMAL LOW (ref 20–29)
Calcium: 9 mg/dL (ref 8.6–10.2)
Chloride: 103 mmol/L (ref 96–106)
Creatinine, Ser: 0.93 mg/dL (ref 0.76–1.27)
Globulin, Total: 2.9 g/dL (ref 1.5–4.5)
Glucose: 120 mg/dL — ABNORMAL HIGH (ref 65–99)
Potassium: 4.3 mmol/L (ref 3.5–5.2)
Sodium: 138 mmol/L (ref 134–144)
Total Protein: 7.6 g/dL (ref 6.0–8.5)
eGFR: 86 mL/min/{1.73_m2} (ref >59)

## 2021-04-22 LAB — TSH: TSH: 1.98 u[IU]/mL (ref 0.450–4.500)

## 2021-04-22 LAB — LIPID PANEL
Chol/HDL Ratio: 5.6 ratio — ABNORMAL HIGH (ref 0.0–5.0)
Cholesterol, Total: 201 mg/dL — ABNORMAL HIGH (ref 100–199)
HDL: 36 mg/dL — ABNORMAL LOW (ref >39)
LDL Chol Calc (NIH): 123 mg/dL — ABNORMAL HIGH (ref 0–99)
Triglycerides: 237 mg/dL — ABNORMAL HIGH (ref 0–149)
VLDL Cholesterol Cal: 42 mg/dL — ABNORMAL HIGH (ref 5–40)

## 2021-04-22 LAB — HEMOGLOBIN A1C
Est. average glucose Bld gHb Est-mCnc: 128 mg/dL
Hgb A1c MFr Bld: 6.1 % — ABNORMAL HIGH (ref 4.8–5.6)

## 2021-10-21 ENCOUNTER — Ambulatory Visit: Payer: Self-pay | Admitting: Family Medicine

## 2021-10-22 ENCOUNTER — Other Ambulatory Visit: Payer: Self-pay | Admitting: Family Medicine

## 2021-10-22 DIAGNOSIS — E039 Hypothyroidism, unspecified: Secondary | ICD-10-CM

## 2021-10-23 ENCOUNTER — Other Ambulatory Visit: Payer: Self-pay | Admitting: Family Medicine

## 2021-10-23 DIAGNOSIS — R0982 Postnasal drip: Secondary | ICD-10-CM

## 2021-10-26 ENCOUNTER — Ambulatory Visit: Payer: Medicare Other | Admitting: Family Medicine

## 2021-11-03 ENCOUNTER — Other Ambulatory Visit: Payer: Self-pay

## 2021-11-03 ENCOUNTER — Encounter: Payer: Self-pay | Admitting: Family Medicine

## 2021-11-03 ENCOUNTER — Ambulatory Visit (INDEPENDENT_AMBULATORY_CARE_PROVIDER_SITE_OTHER): Payer: Medicare Other | Admitting: Family Medicine

## 2021-11-03 VITALS — BP 157/84 | HR 86 | Resp 16 | Wt 202.6 lb

## 2021-11-03 DIAGNOSIS — R7303 Prediabetes: Secondary | ICD-10-CM

## 2021-11-03 DIAGNOSIS — E785 Hyperlipidemia, unspecified: Secondary | ICD-10-CM

## 2021-11-03 DIAGNOSIS — E039 Hypothyroidism, unspecified: Secondary | ICD-10-CM | POA: Diagnosis not present

## 2021-11-03 NOTE — Progress Notes (Signed)
° °  SUBJECTIVE:   CHIEF COMPLAINT / HPI:   Diabetes, Type 2 - Last A1c 6.1 04/2021 - Medications: none - Compliance: n/a - Checking BG at home: occasionally - Denies symptoms of hypoglycemia, polyuria, polydipsia, numbness extremities, foot ulcers/trauma  HLD - medications: fish oil - compliance: good - medication SEs: none  Hypothyroidism - Medications: Synthroid 39mcg - Current symptoms:  none - Denies heat / cold intolerance, nervousness, palpitations, and weight changes - Symptoms have been basically asymptomatic   OBJECTIVE:   BP (!) 157/84 (BP Location: Left Arm, Patient Position: Sitting, Cuff Size: Large)    Pulse 86    Resp 16    Wt 202 lb 9.6 oz (91.9 kg)    BMI 26.01 kg/m   Gen: well appearing, in NAD Card: RRR Lungs: CTAB Ext: WWP, no edema   ASSESSMENT/PLAN:   Adult hypothyroidism Recheck labs.  Borderline diabetes Recheck a1c and adjust as indicated.  HLD (hyperlipidemia) Recheck labs.    Elevated BP Elevated today with previous measurements at goal for age. Will recheck at followup.   Myles Gip, DO

## 2021-11-04 LAB — BASIC METABOLIC PANEL
BUN/Creatinine Ratio: 17 (ref 10–24)
BUN: 18 mg/dL (ref 8–27)
CO2: 23 mmol/L (ref 20–29)
Calcium: 9.4 mg/dL (ref 8.6–10.2)
Chloride: 102 mmol/L (ref 96–106)
Creatinine, Ser: 1.03 mg/dL (ref 0.76–1.27)
Glucose: 103 mg/dL — ABNORMAL HIGH (ref 70–99)
Potassium: 4.2 mmol/L (ref 3.5–5.2)
Sodium: 139 mmol/L (ref 134–144)
eGFR: 76 mL/min/{1.73_m2} (ref >59)

## 2021-11-04 LAB — LIPID PANEL
Chol/HDL Ratio: 5.5 ratio — ABNORMAL HIGH (ref 0.0–5.0)
Cholesterol, Total: 220 mg/dL — ABNORMAL HIGH (ref 100–199)
HDL: 40 mg/dL (ref >39)
LDL Chol Calc (NIH): 101 mg/dL — ABNORMAL HIGH (ref 0–99)
Triglycerides: 469 mg/dL — ABNORMAL HIGH (ref 0–149)
VLDL Cholesterol Cal: 79 mg/dL — ABNORMAL HIGH (ref 5–40)

## 2021-11-04 LAB — HEMOGLOBIN A1C
Est. average glucose Bld gHb Est-mCnc: 123 mg/dL
Hgb A1c MFr Bld: 5.9 % — ABNORMAL HIGH (ref 4.8–5.6)

## 2021-11-04 LAB — TSH: TSH: 1.29 u[IU]/mL (ref 0.450–4.500)

## 2021-11-04 NOTE — Assessment & Plan Note (Signed)
Recheck labs 

## 2021-11-04 NOTE — Assessment & Plan Note (Signed)
Recheck a1c and adjust as indicated. 

## 2021-11-17 NOTE — Addendum Note (Signed)
Addended by: Myles Gip on: 11/17/2021 09:42 AM   Modules accepted: Orders

## 2021-11-20 ENCOUNTER — Other Ambulatory Visit: Payer: Self-pay | Admitting: Family Medicine

## 2021-11-20 DIAGNOSIS — E782 Mixed hyperlipidemia: Secondary | ICD-10-CM | POA: Diagnosis not present

## 2021-11-21 LAB — LIPID PANEL
Chol/HDL Ratio: 5.1 ratio — ABNORMAL HIGH (ref 0.0–5.0)
Cholesterol, Total: 221 mg/dL — ABNORMAL HIGH (ref 100–199)
HDL: 43 mg/dL (ref >39)
LDL Chol Calc (NIH): 148 mg/dL — ABNORMAL HIGH (ref 0–99)
Triglycerides: 163 mg/dL — ABNORMAL HIGH (ref 0–149)
VLDL Cholesterol Cal: 30 mg/dL (ref 5–40)

## 2022-01-21 ENCOUNTER — Other Ambulatory Visit: Payer: Self-pay | Admitting: Family Medicine

## 2022-02-09 ENCOUNTER — Telehealth: Payer: Self-pay | Admitting: *Deleted

## 2022-02-09 NOTE — Chronic Care Management (AMB) (Signed)
?  Care Management  ? ?Note ? ?02/09/2022 ?Name: David Trevino MRN: [id number] DOB: Dec 31, 1945 ? ?David Trevino is a 76 y.o. year old male who is a primary care patient of [name], MD. I reached out to David Trevino by phone today offer care coordination services.  ? ?David Trevino was given information about care management services today including:  ?Care management services include personalized support from designated clinical staff supervised by his physician, including individualized plan of care and coordination with other care providers ?24/7 contact phone numbers for assistance for urgent and routine care needs. ?The patient may stop care management services at any time by phone call to the office staff. ? ?Patient agreed to services and verbal consent obtained.  ? ?Follow up plan: ?Telephone appointment with care management team member scheduled for: 02/16/2022 ? ?Romie Keeble, CCMA ?Care Guide, Embedded Care Coordination ?[place]  Care Management  ?Direct Dial: [phone] ? ? ?

## 2022-02-16 ENCOUNTER — Ambulatory Visit: Payer: Medicare Other

## 2022-02-16 DIAGNOSIS — E785 Hyperlipidemia, unspecified: Secondary | ICD-10-CM

## 2022-02-16 NOTE — Chronic Care Management (AMB) (Signed)
Care Management    RN Visit Note  02/16/2022 Name: David Trevino MRN: [id number] DOB: 1946-08-08  Subjective: David Trevino is a 76 y.o. year old male who is a primary care patient of [name], MD. The care management team was consulted for assistance with disease management and care coordination needs.    Engaged with patient by telephone for initial visit in response to provider referral for case management and care coordination services.   Consent to Services:   [name] was given information about Care Management services including:  Care Management services includes personalized support from designated clinical staff supervised by his physician, including individualized plan of care and coordination with other care providers 24/7 contact phone numbers for assistance for urgent and routine care needs. The patient may stop case management services at any time by phone call to the office staff.  Patient agreed to services and consent obtained.   Assessment: Review of patient past medical history, allergies, medications, health status, including review of consultants reports, laboratory and other test data, was performed as part of comprehensive evaluation and provision of chronic care management services.   SDOH (Social Determinants of Health) assessments and interventions performed:  SDOH Interventions    Flowsheet Row Most Recent Value  SDOH Interventions   Food Insecurity Interventions Intervention Not Indicated  Transportation Interventions Intervention Not Indicated        Care Plan  No Known Allergies  Outpatient Encounter Medications as of 02/16/2022  Medication Sig Note   aspirin-acetaminophen-caffeine (EXCEDRIN MIGRAINE) 250-250-65 MG tablet Take 1 tablet by mouth every 6 (six) hours as needed for headache.    Cholecalciferol (VITAMIN D) 2000 UNITS tablet Take 2,000 Units by mouth daily.  09/03/2015: Received from: [place]   ipratropium (ATROVENT) 0.03 % nasal spray Place 2 sprays into both nostrils every 12 (twelve) hours.    levothyroxine (SYNTHROID) 50 MCG tablet TAKE 1 TABLET BY MOUTH DAILY    loratadine (CLARITIN) 10 MG tablet TAKE 1 TABLET(10 MG) BY MOUTH DAILY    Omega-3 1000 MG CAPS Take by mouth daily.     No facility-administered encounter medications on file as of 02/16/2022.    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  RNCM Clinical Goal(s):  Patient will demonstrate ongoing adherence to prescribed treatment plan for HLD and Chronic Airway Obstruction through collaboration with the provider, RN Care manager, and care team.   Interventions: 1:1 collaboration with primary care provider regarding development and update of comprehensive plan of care as evidenced by provider attestation and co-signature Inter-disciplinary care team collaboration (see longitudinal plan of care) Evaluation of current treatment plan related to  self management and patient's adherence to plan as established by provider   Chronic Airway Obstruction Interventions:   (Status:  New goal.) Long Term Goal Reviewed treatment plan for Chronic Airway Obstruction. Discussed triggers. Reports symptoms have been controlled. Advised to continue taking needed precautions to prevent exacerbation.  Reviewed current action plan and reinforced importance of daily self-assessment. Advised to make an appointment with provider if experiencing moderate symptoms for greater than 48 hours without improvement.  Provided information regarding infection prevention and increased risk r/t Chronic Airway Obstruction. Advised to utilize prevention strategies to reduce risk of respiratory infection  Reviewed worsening symptoms that require immediate medical attention.    Hyperlipidemia Interventions:  (Status:  New goal.) Long Term Goal Lab Results  Component Value Date   CHOL 221 (H) 11/20/2021   HDL 43 11/20/2021   LDLCALC 148 (H) 11/20/2021   TRIG 163 (H) 11/20/2021   CHOLHDL 5.1 (H) 11/20/2021    Reviewed plan for hyperlipidemia. Currently attempting to control with diet and exercise. Reviewed provider established cholesterol goals. Discussed importance of completing regular laboratory monitoring as prescribed. Reviewed importance of limiting foods high in cholesterol. Advised to continue reading nutrition labels and avoiding highly processed foods when possible. Reviewed exercise goals. Advised to continue engaging in low impact exercises as tolerated.   Patient Goals/Self-Care Activities: Take all medications as prescribed Attend all scheduled provider appointments Call pharmacy for medication refills 3-7 days in advance of running out of medications Call provider office for new concerns or questions    Follow Up Plan:   Will follow up in three months        PLAN:  A member of the care management team will follow up in three months.   [facility] Care Management [facility] [phone]

## 2022-02-21 NOTE — Patient Instructions (Signed)
Thank you for allowing the Chronic Care Management team to participate in your care. It was great speaking with you! ? ? ? ?Following is a copy of your care plan:  ?Care Plan : RN Care Management Plan of Care  ? ?Problem: HLD, Chronic Airway Obstruction   ?  ? ?Long-Range Goal: Disease Progression Prevented or Minimized   ?Start Date: 02/16/2022  ?Expected End Date: 05/17/2022  ?Priority: High  ?Note:   ?Current Barriers:  ?Disease Management support and education needs related to HLD and Chronic Airway Obstruction. ? ?RNCM Clinical Goal(s):  ?Patient will demonstrate ongoing adherence to prescribed treatment plan for HLD and Chronic Airway Obstruction through collaboration with the provider, RN Care manager, and care team.  ? ?Interventions: ?1:1 collaboration with primary care provider regarding development and update of comprehensive plan of care as evidenced by provider attestation and co-signature ?Inter-disciplinary care team collaboration (see longitudinal plan of care) ?Evaluation of current treatment plan related to  self management and patient's adherence to plan as established by provider ? ? ?Chronic Airway Obstruction Interventions:  (Status:  New goal.) Long Term Goal ?Reviewed treatment plan for Chronic Airway Obstruction. ?Discussed triggers. Reports symptoms have been controlled. Advised to continue taking needed precautions to prevent exacerbation.  ?Reviewed current action plan and reinforced importance of daily self-assessment. Advised to make an appointment with provider if experiencing moderate symptoms for greater than 48 hours without improvement.  ?Provided information regarding infection prevention and increased risk r/t Chronic Airway Obstruction. Advised to utilize prevention strategies to reduce risk of respiratory infection  ?Reviewed worsening symptoms that require immediate medical attention.  ? ? ?Hyperlipidemia Interventions:  (Status:  New goal.) Long Term Goal ?Lab Results   ?Component Value Date  ? CHOL 221 (H) 11/20/2021  ? HDL 43 11/20/2021  ? LDLCALC 148 (H) 11/20/2021  ? TRIG 163 (H) 11/20/2021  ? CHOLHDL 5.1 (H) 11/20/2021  ?  ?Reviewed plan for hyperlipidemia. Currently attempting to control with diet and exercise. ?Reviewed provider established cholesterol goals. ?Discussed importance of completing regular laboratory monitoring as prescribed. ?Reviewed importance of limiting foods high in cholesterol. Advised to continue reading nutrition labels and avoiding highly processed foods when possible. ?Reviewed exercise goals. Advised to continue engaging in low impact exercises as tolerated. ? ? ?Patient Goals/Self-Care Activities: ?Take all medications as prescribed ?Attend all scheduled provider appointments ?Call pharmacy for medication refills 3-7 days in advance of running out of medications ?Call provider office for new concerns or questions  ? ? ?Follow Up Plan:   ?Will follow up in three months ?  ? ? ?The patient verbalized understanding of instructions, educational materials, and care plan provided today and declined offer to receive copy of patient instructions, educational materials, and care plan.  ? ?A member of the care management team will follow up in three months. ? ?Loribeth Katich,RN ?[place]/[facility] Care Management ?[place] ?[phone]  ? ?  ?

## 2022-04-17 ENCOUNTER — Other Ambulatory Visit: Payer: Self-pay | Admitting: Family Medicine

## 2022-04-17 DIAGNOSIS — R0982 Postnasal drip: Secondary | ICD-10-CM

## 2022-04-28 ENCOUNTER — Ambulatory Visit (INDEPENDENT_AMBULATORY_CARE_PROVIDER_SITE_OTHER): Payer: Medicare Other

## 2022-04-28 VITALS — BP 140/82 | Ht 74.0 in | Wt 201.6 lb

## 2022-04-28 DIAGNOSIS — Z0000 Encounter for general adult medical examination without abnormal findings: Secondary | ICD-10-CM

## 2022-04-28 NOTE — Progress Notes (Signed)
Subjective:   David Trevino is a 76 y.o. male who presents for Medicare Annual/Subsequent preventive examination.  Review of Systems           Objective:    Today's Vitals   04/28/22 0955  BP: 140/82  Weight: 201 lb 9.6 oz (91.4 kg)  Height: '6\' 2"'$  (1.88 m)   Body mass index is 25.88 kg/m.     04/14/2020    1:37 PM 04/11/2019    1:38 PM 03/28/2018    9:02 AM 07/16/2017    4:50 PM 09/21/2016    8:45 AM  Advanced Directives  Does Patient Have a Medical Advance Directive? No No No No No  Would patient like information on creating a medical advance directive? No - Patient declined No - Patient declined Yes (MAU/Ambulatory/Procedural Areas - Information given) No - Patient declined     Current Medications (verified) Outpatient Encounter Medications as of 04/28/2022  Medication Sig   aspirin-acetaminophen-caffeine (EXCEDRIN MIGRAINE) 250-250-65 MG tablet Take 1 tablet by mouth every 6 (six) hours as needed for headache.   Cholecalciferol (VITAMIN D) 2000 UNITS tablet Take 2,000 Units by mouth daily.    ipratropium (ATROVENT) 0.03 % nasal spray Place 2 sprays into both nostrils every 12 (twelve) hours.   levothyroxine (SYNTHROID) 50 MCG tablet TAKE 1 TABLET BY MOUTH DAILY   loratadine (CLARITIN) 10 MG tablet TAKE 1 TABLET(10 MG) BY MOUTH DAILY   Omega-3 1000 MG CAPS Take by mouth daily.    No facility-administered encounter medications on file as of 04/28/2022.    Allergies (verified) Patient has no known allergies.   History: Past Medical History:  Diagnosis Date   Colon cancer ([place])    Hyperlipidemia    Personal history of tobacco use, presenting hazards to health 09/17/2015   Past Surgical History:  Procedure Laterality Date   CATARACT EXTRACTION Bilateral    was done in 2 different months but unsure of which months.    COLECTOMY  2007   S/P right and transverse colectomy with anastomosis of the ileal and descending colon for a T1   COLON SURGERY     removal  of partial colon   HERNIA REPAIR  08/2007   HERNIA REPAIR Right    lower right   RETINAL DETACHMENT SURGERY Right 11/2010   TONSILLECTOMY AND ADENOIDECTOMY  1960   Family History  Problem Relation Age of Onset   Arthritis Mother    Diabetes Mother    Chronic Renal Failure Mother    Cancer Father        bladder   Kidney disease Father    Bladder Cancer Father    Chronic Renal Failure Father    Allergies Sister    Cancer Brother        prostate   Social History   Socioeconomic History   Marital status: Married    Spouse name: Not on file   Number of children: 2   Years of education: Not on file   Highest education level: Some college, no degree  Occupational History   Occupation: retired  Tobacco Use   Smoking status: Former    Packs/day: 1.00    Years: 40.00    Total pack years: 40.00    Types: Cigarettes    Quit date: 11/21/2005    Years since quitting: 16.4   Smokeless tobacco: Never  Vaping Use   Vaping Use: Never used  Substance and Sexual Activity   Alcohol use: No    Comment: Quit  drinking alcohol 10/16/2005   Drug use: No   Sexual activity: Not on file  Other Topics Concern   Not on file  Social History Narrative   Not on file   Social Determinants of Health   Financial Resource Strain: Low Risk  (04/14/2020)   Overall Financial Resource Strain (CARDIA)    Difficulty of Paying Living Expenses: Not hard at all  Food Insecurity: No Food Insecurity (02/16/2022)   Hunger Vital Sign    Worried About Running Out of Food in the Last Year: Never true    Ran Out of Food in the Last Year: Never true  Transportation Needs: No Transportation Needs (02/16/2022)   PRAPARE - Hydrologist (Medical): No    Lack of Transportation (Non-Medical): No  Physical Activity: Insufficiently Active (04/28/2022)   Exercise Vital Sign    Days of Exercise per Week: 7 days    Minutes of Exercise per Session: 20 min  Stress: No Stress Concern Present  (04/28/2022)   [place]    Feeling of Stress : Not at all  Social Connections: Moderately Integrated (04/14/2020)   Social Connection and Isolation Panel [NHANES]    Frequency of Communication with Friends and Family: More than three times a week    Frequency of Social Gatherings with Friends and Family: More than three times a week    Attends Religious Services: More than 4 times per year    Active Member of [employer] or Organizations: No    Attends Music therapist: Never    Marital Status: Married    Tobacco Counseling Counseling given: Not Answered   Clinical Intake:  Pre-visit preparation completed: Yes  Pain : No/denies pain     Diabetes: No  How often do you need to have someone help you when you read instructions, pamphlets, or other written materials from your doctor or pharmacy?: 1 - Never  Diabetic?no  Interpreter Needed?: No  Information entered by :: Kirke Shaggy, LPN   Activities of Daily Living    04/28/2022    8:13 AM 11/03/2021    2:00 PM  In your present state of health, do you have any difficulty performing the following activities:  Hearing? 0 0  Vision? 0 0  Difficulty concentrating or making decisions? 0 0  Walking or climbing stairs? 1 1  Dressing or bathing? 0 0  Doing errands, shopping? 0 0  Preparing Food and eating ? N   Using the Toilet? N   In the past six months, have you accidently leaked urine? N   Do you have problems with loss of bowel control? N   Managing your Medications? N   Managing your Finances? N   Housekeeping or managing your Housekeeping? N     Patient Care Team: [name], MD as PCP - General (Family Medicine) Thelma Comp, MD as Referring Physician (Gastroenterology) Billey Co, MD as Consulting Physician (Urology) Neldon Labella, RN as Case Manager  Indicate any recent Medical Services you may have received from  other than [facility] providers in the past year (date may be approximate).     Assessment:   This is a routine wellness examination for [facility].  Hearing/Vision screen No results found.  Dietary issues and exercise activities discussed:     Goals Addressed             This Visit's Progress    DIET - EAT MORE  FRUITS AND VEGETABLES         Depression Screen    04/28/2022    9:59 AM 11/03/2021    2:00 PM 04/14/2020    1:34 PM 04/11/2019    1:37 PM 03/28/2018    9:03 AM 10/03/2017    4:01 PM 09/21/2016    8:45 AM  PHQ 2/9 Scores  PHQ - 2 Score 0 0 0 0 0 0 0  PHQ- 9 Score 0          Fall Risk    04/28/2022    8:13 AM 02/16/2022   10:09 AM 11/03/2021    2:00 PM 04/14/2020    1:37 PM 04/11/2019    1:37 PM  Fall Risk   Falls in the past year? '1 1 1 1 1  '$ Comment    accidental falls   Number falls in past yr: 1 1 0 1 1  Injury with Fall? 0 0 1 0 0  Risk for fall due to :  History of fall(s);Impaired balance/gait     Follow up  Falls prevention discussed  Falls prevention discussed Falls prevention discussed    FALL RISK PREVENTION PERTAINING TO THE HOME:  Any stairs in or around the home? No  If so, are there any without handrails? No  Home free of loose throw rugs in walkways, pet beds, electrical cords, etc? Yes Yes  Adequate lighting in your home to reduce risk of falls?   ASSISTIVE DEVICES UTILIZED TO PREVENT FALLS:  Life alert? No  Use of a cane, walker or w/c? No  Grab bars in the bathroom? No  Shower chair or bench in shower? No  Elevated toilet seat or a handicapped toilet? Yes   TIMED UP AND GO:  Was the test performed? Yes .  Length of time to ambulate 10 feet: 4 sec.   Gait steady and fast without use of assistive device  Cognitive Function:        04/14/2020    1:46 PM 03/28/2018    9:08 AM 09/21/2016    8:55 AM  6CIT Screen  What Year? 0 points 0 points 0 points  What month? 0 points 0 points 0 points  What time? 0 points 0 points 0 points   Count back from 20 0 points 0 points 0 points  Months in reverse 0 points 0 points 0 points  Repeat phrase 0 points 0 points 0 points  Total Score 0 points 0 points 0 points    Immunizations Immunization History  Administered Date(s) Administered   Influenza, High Dose Seasonal PF 09/27/2018   Influenza,inj,Quad PF,6+ Mos 08/06/2013   PFIZER(Purple Top)SARS-COV-2 Vaccination 02/14/2020, 03/06/2020   Pneumococcal Conjugate-13 09/11/2015   Pneumococcal Polysaccharide-23 07/17/2012   Tdap 05/02/2007    TDAP status: Due, Education has been provided regarding the importance of this vaccine. Advised may receive this vaccine at local pharmacy or Health Dept. Aware to provide a copy of the vaccination record if obtained from local pharmacy or Health Dept. Verbalized acceptance and understanding.  Flu Vaccine status: Declined, Education has been provided regarding the importance of this vaccine but patient still declined. Advised may receive this vaccine at local pharmacy or Health Dept. Aware to provide a copy of the vaccination record if obtained from local pharmacy or Health Dept. Verbalized acceptance and understanding.  Pneumococcal vaccine status: Up to date  Covid-19 vaccine status: Completed vaccines  Qualifies for Shingles Vaccine? Yes   Zostavax completed No   Shingrix Completed?: No.  Education has been provided regarding the importance of this vaccine. Patient has been advised to call insurance company to determine out of pocket expense if they have not yet received this vaccine. Advised may also receive vaccine at local pharmacy or Health Dept. Verbalized acceptance and understanding.  Screening Tests Health Maintenance  Topic Date Due   Zoster Vaccines- Shingrix (1 of 2) Never done   TETANUS/TDAP  05/01/2017   COLONOSCOPY (Pts 45-34yr Insurance coverage will need to be confirmed)  02/19/2019   COVID-19 Vaccine (3 - Pfizer risk series) 04/03/2020   INFLUENZA VACCINE   06/08/2022   Pneumonia Vaccine 75 Years old  Completed   Hepatitis C Screening  Completed   HPV VACCINES  Aged Out    Health Maintenance  Health Maintenance Due  Topic Date Due   Zoster Vaccines- Shingrix (1 of 2) Never done   TETANUS/TDAP  05/01/2017   COLONOSCOPY (Pts 45-424yrInsurance coverage will need to be confirmed)  02/19/2019   COVID-19 Vaccine (3 - Pfizer risk series) 04/03/2020    Colorectal cancer screening: Type of screening: Colonoscopy. Completed 02/18/14. Repeat every 5 years- [facility] (Low Dose CT Chest recommended if Age 75-80ears, 30 pack-year currently smoking OR have quit w/in 15years.) does not qualify.   Additional Screening:  Hepatitis C Screening: does qualify; Completed 03/22/17  Vision Screening: Recommended annual ophthalmology exams for early detection of glaucoma and other disorders of the eye. Is the patient up to date with their annual eye exam?  Yes  Who is the provider or what is the name of the office in which the patient attends annual eye exams? [facility] provider, would they like to be referred to a provider to establish care? No .   Dental Screening: Recommended annual dental exams for proper oral hygiene  Community Resource Referral / Chronic Care Management: CRR required this visit?  No   CCM required this visit?  No      Plan:     I have personally reviewed and noted the following in the patient's chart:   Medical and social history Use of alcohol, tobacco or illicit drugs  Current medications and supplements including opioid prescriptions. Patient is not currently taking opioid prescriptions. Functional ability and status Nutritional status Physical activity Advanced directives List of other physicians Hospitalizations, surgeries, and ER visits in previous 12 months Vitals Screenings to include cognitive, depression, and falls Referrals and  appointments  In addition, I have reviewed and discussed with patient certain preventive protocols, quality metrics, and best practice recommendations. A written personalized care plan for preventive services as well as general preventive health recommendations were provided to patient.     LoDionisio DavidLPN   04/10/00/6010 Nurse Notes: none

## 2022-04-28 NOTE — Patient Instructions (Signed)
David Trevino , Thank you for taking time to come for your Medicare Wellness Visit. I appreciate your ongoing commitment to your health goals. Please review the following plan we discussed and let me know if I can assist you in the future.   Screening recommendations/referrals: Colonoscopy: has appointment at [facility] Recommended yearly ophthalmology/optometry visit for glaucoma screening and checkup Recommended yearly dental visit for hygiene and checkup  Vaccinations: Influenza vaccine: n/c Pneumococcal vaccine: 09/11/15 Tdap vaccine: 05/02/07, due if have injury Shingles vaccine: n/d   Covid-19: 02/14/20, 03/06/20  Advanced directives: no  Conditions/risks identified: none  Next appointment: Follow up in one year for your annual wellness visit. 05/03/23@ 10 am in person  Preventive Care 75 Years and Older, Male Preventive care refers to lifestyle choices and visits with your health care provider that can promote health and wellness. What does preventive care include? A yearly physical exam. This is also called an annual well check. Dental exams once or twice a year. Routine eye exams. Ask your health care provider how often you should have your eyes checked. Personal lifestyle choices, including: Daily care of your teeth and gums. Regular physical activity. Eating a healthy diet. Avoiding tobacco and drug use. Limiting alcohol use. Practicing safe sex. Taking low doses of aspirin every day. Taking vitamin and mineral supplements as recommended by your health care provider. What happens during an annual well check? The services and screenings done by your health care provider during your annual well check will depend on your age, overall health, lifestyle risk factors, and family history of disease. Counseling  Your health care provider may ask you questions about your: Alcohol use. Tobacco use. Drug use. Emotional well-being. Home and relationship well-being. Sexual  activity. Eating habits. History of falls. Memory and ability to understand (cognition). Work and work Statistician. Screening  You may have the following tests or measurements: Height, weight, and BMI. Blood pressure. Lipid and cholesterol levels. These may be checked every 5 years, or more frequently if you are over 63 years old. Skin check. Lung cancer screening. You may have this screening every year starting at age 75 if you have a 30-pack-year history of smoking and currently smoke or have quit within the past 75 years. Fecal occult blood test (FOBT) of the stool. You may have this test every year starting at age 75. Flexible sigmoidoscopy or colonoscopy. You may have a sigmoidoscopy every 5 years or a colonoscopy every 10 years starting at age 75. Prostate cancer screening. Recommendations will vary depending on your family history and other risks. Hepatitis C blood test. Hepatitis B blood test. Sexually transmitted disease (STD) testing. Diabetes screening. This is done by checking your blood sugar (glucose) after you have not eaten for a while (fasting). You may have this done every 1-3 years. Abdominal aortic aneurysm (AAA) screening. You may need this if you are a current or former smoker. Osteoporosis. You may be screened starting at age 75 if you are at high risk. Talk with your health care provider about your test results, treatment options, and if necessary, the need for more tests. Vaccines  Your health care provider may recommend certain vaccines, such as: Influenza vaccine. This is recommended every year. Tetanus, diphtheria, and acellular pertussis (Tdap, Td) vaccine. You may need a Td booster every 10 years. Zoster vaccine. You may need this after age 75. Pneumococcal 13-valent conjugate (PCV13) vaccine. One dose is recommended after age 75. Pneumococcal polysaccharide (PPSV23) vaccine. One dose is recommended after age 75.  Talk to your health care provider about which  screenings and vaccines you need and how often you need them. This information is not intended to replace advice given to you by your health care provider. Make sure you discuss any questions you have with your health care provider. Document Released: 11/21/2015 Document Revised: 07/14/2016 Document Reviewed: 08/26/2015 Elsevier Interactive Patient Education  2017 [employer] Prevention in the Home Falls can cause injuries. They can happen to people of all ages. There are many things you can do to make your home safe and to help prevent falls. What can I do on the outside of my home? Regularly fix the edges of walkways and driveways and fix any cracks. Remove anything that might make you trip as you walk through a door, such as a raised step or threshold. Trim any bushes or trees on the path to your home. Use bright outdoor lighting. Clear any walking paths of anything that might make someone trip, such as rocks or tools. Regularly check to see if handrails are loose or broken. Make sure that both sides of any steps have handrails. Any raised decks and porches should have guardrails on the edges. Have any leaves, snow, or ice cleared regularly. Use sand or salt on walking paths during winter. Clean up any spills in your garage right away. This includes oil or grease spills. What can I do in the bathroom? Use night lights. Install grab bars by the toilet and in the tub and shower. Do not use towel bars as grab bars. Use non-skid mats or decals in the tub or shower. If you need to sit down in the shower, use a plastic, non-slip stool. Keep the floor dry. Clean up any water that spills on the floor as soon as it happens. Remove soap buildup in the tub or shower regularly. Attach bath mats securely with double-sided non-slip rug tape. Do not have throw rugs and other things on the floor that can make you trip. What can I do in the bedroom? Use night lights. Make sure that you have a  light by your bed that is easy to reach. Do not use any sheets or blankets that are too big for your bed. They should not hang down onto the floor. Have a firm chair that has side arms. You can use this for support while you get dressed. Do not have throw rugs and other things on the floor that can make you trip. What can I do in the kitchen? Clean up any spills right away. Avoid walking on wet floors. Keep items that you use a lot in easy-to-reach places. If you need to reach something above you, use a strong step stool that has a grab bar. Keep electrical cords out of the way. Do not use floor polish or wax that makes floors slippery. If you must use wax, use non-skid floor wax. Do not have throw rugs and other things on the floor that can make you trip. What can I do with my stairs? Do not leave any items on the stairs. Make sure that there are handrails on both sides of the stairs and use them. Fix handrails that are broken or loose. Make sure that handrails are as long as the stairways. Check any carpeting to make sure that it is firmly attached to the stairs. Fix any carpet that is loose or worn. Avoid having throw rugs at the top or bottom of the stairs. If you do have throw  rugs, attach them to the floor with carpet tape. Make sure that you have a light switch at the top of the stairs and the bottom of the stairs. If you do not have them, ask someone to add them for you. What else can I do to help prevent falls? Wear shoes that: Do not have high heels. Have rubber bottoms. Are comfortable and fit you well. Are closed at the toe. Do not wear sandals. If you use a stepladder: Make sure that it is fully opened. Do not climb a closed stepladder. Make sure that both sides of the stepladder are locked into place. Ask someone to hold it for you, if possible. Clearly mark and make sure that you can see: Any grab bars or handrails. First and last steps. Where the edge of each step  is. Use tools that help you move around (mobility aids) if they are needed. These include: Canes. Walkers. Scooters. Crutches. Turn on the lights when you go into a dark area. Replace any light bulbs as soon as they burn out. Set up your furniture so you have a clear path. Avoid moving your furniture around. If any of your floors are uneven, fix them. If there are any pets around you, be aware of where they are. Review your medicines with your doctor. Some medicines can make you feel dizzy. This can increase your chance of falling. Ask your doctor what other things that you can do to help prevent falls. This information is not intended to replace advice given to you by your health care provider. Make sure you discuss any questions you have with your health care provider. Document Released: 08/21/2009 Document Revised: 04/01/2016 Document Reviewed: 11/29/2014 Elsevier Interactive Patient Education  2017 [employer].

## 2022-05-04 ENCOUNTER — Ambulatory Visit (INDEPENDENT_AMBULATORY_CARE_PROVIDER_SITE_OTHER): Payer: Medicare Other | Admitting: Family Medicine

## 2022-05-04 ENCOUNTER — Encounter: Payer: Self-pay | Admitting: Family Medicine

## 2022-05-04 VITALS — BP 130/70 | HR 74 | Resp 16 | Wt 197.0 lb

## 2022-05-04 DIAGNOSIS — E785 Hyperlipidemia, unspecified: Secondary | ICD-10-CM | POA: Diagnosis not present

## 2022-05-04 DIAGNOSIS — Z85038 Personal history of other malignant neoplasm of large intestine: Secondary | ICD-10-CM | POA: Diagnosis not present

## 2022-05-04 DIAGNOSIS — E559 Vitamin D deficiency, unspecified: Secondary | ICD-10-CM | POA: Diagnosis not present

## 2022-05-04 DIAGNOSIS — E039 Hypothyroidism, unspecified: Secondary | ICD-10-CM

## 2022-05-04 DIAGNOSIS — R7303 Prediabetes: Secondary | ICD-10-CM

## 2022-06-17 ENCOUNTER — Ambulatory Visit: Payer: Self-pay

## 2022-06-17 NOTE — Chronic Care Management (AMB) (Signed)
   06/17/2022  David Trevino 02-Mar-1946 [id number]  Documentation encounter created to complete case transition. The care management team will continue to follow for care coordination.   [facility] [phone]

## 2022-06-21 DIAGNOSIS — Z86010 Personal history of colonic polyps: Secondary | ICD-10-CM | POA: Diagnosis not present

## 2022-06-21 DIAGNOSIS — Z1211 Encounter for screening for malignant neoplasm of colon: Secondary | ICD-10-CM | POA: Diagnosis not present

## 2022-06-21 DIAGNOSIS — Z980 Intestinal bypass and anastomosis status: Secondary | ICD-10-CM | POA: Diagnosis not present

## 2022-06-21 DIAGNOSIS — Z85038 Personal history of other malignant neoplasm of large intestine: Secondary | ICD-10-CM | POA: Diagnosis not present

## 2022-06-21 DIAGNOSIS — E039 Hypothyroidism, unspecified: Secondary | ICD-10-CM | POA: Diagnosis not present

## 2022-06-21 DIAGNOSIS — J449 Chronic obstructive pulmonary disease, unspecified: Secondary | ICD-10-CM | POA: Diagnosis not present

## 2022-07-05 ENCOUNTER — Encounter: Payer: Self-pay | Admitting: Family Medicine

## 2022-07-05 ENCOUNTER — Ambulatory Visit (INDEPENDENT_AMBULATORY_CARE_PROVIDER_SITE_OTHER): Payer: Medicare Other | Admitting: Family Medicine

## 2022-07-05 VITALS — BP 124/78 | HR 78 | Temp 98.3°F | Resp 16 | Ht 74.0 in | Wt 190.0 lb

## 2022-07-05 DIAGNOSIS — L02412 Cutaneous abscess of left axilla: Secondary | ICD-10-CM | POA: Diagnosis not present

## 2022-07-05 DIAGNOSIS — Z85038 Personal history of other malignant neoplasm of large intestine: Secondary | ICD-10-CM | POA: Diagnosis not present

## 2022-07-05 NOTE — Progress Notes (Unsigned)
I,Tiffany J Bragg,acting as a scribe for Wilhemena Durie, MD.,have documented all relevant documentation on the behalf of Wilhemena Durie, MD,as directed by  Wilhemena Durie, MD while in the presence of Wilhemena Durie, MD.   Established patient visit   Patient: David Trevino   DOB: Sep 07, 1946   76 y.o. Male  MRN: [id number] Visit Date: 07/05/2022  Today's healthcare provider: Wilhemena Durie, MD   Chief Complaint  Patient presents with   Mass    Patient complains of a lump in his L armpit that has gotten painful, red, and warm starting 2 days ago.    Subjective    HPI HPI     Mass    Additional comments: Patient complains of a lump in his L armpit that has gotten painful, red, and warm starting 2 days ago.       Last edited by Smitty Knudsen, CMA on 07/05/2022  2:01 PM.      Patient states that the broken nerves are much Larger and more tender over the last few days.  No drainage. Wife accompanies him today because she is concerned about 10 pound weight loss in addition to the axillary growth  Medications: Outpatient Medications Prior to Visit  Medication Sig   aspirin-acetaminophen-caffeine (EXCEDRIN MIGRAINE) 250-250-65 MG tablet Take 1 tablet by mouth every 6 (six) hours as needed for headache.   Cholecalciferol (VITAMIN D) 2000 UNITS tablet Take 2,000 Units by mouth daily.    ipratropium (ATROVENT) 0.03 % nasal spray Place 2 sprays into both nostrils every 12 (twelve) hours.   levothyroxine (SYNTHROID) 50 MCG tablet TAKE 1 TABLET BY MOUTH DAILY   loratadine (CLARITIN) 10 MG tablet TAKE 1 TABLET(10 MG) BY MOUTH DAILY   Omega-3 1000 MG CAPS Take by mouth daily.    No facility-administered medications prior to visit.    Review of Systems  Last hemoglobin A1c Lab Results  Component Value Date   HGBA1C 5.9 (H) 11/03/2021       Objective    BP 124/78 (BP Location: Right Arm, Patient Position: Sitting, Cuff Size: Normal)   Pulse 78    Temp 98.3 F (36.8 C) (Oral)   Resp 16   Ht '6\' 2"'$  (1.88 m)   Wt 190 lb (86.2 kg)   SpO2 98%   BMI 24.39 kg/m  BP Readings from Last 3 Encounters:  07/05/22 124/78  05/04/22 130/70  04/28/22 140/82   Wt Readings from Last 3 Encounters:  07/05/22 190 lb (86.2 kg)  05/04/22 197 lb (89.4 kg)  04/28/22 201 lb 9.6 oz (91.4 kg)      Physical Exam Vitals reviewed.  Constitutional:      General: He is not in acute distress.    Appearance: He is well-developed.  HENT:     Head: Normocephalic and atraumatic.     Right Ear: Hearing normal.     Left Ear: Hearing normal.     Nose: Nose normal.  Eyes:     General: Lids are normal. No scleral icterus.       Right eye: No discharge.        Left eye: No discharge.     Conjunctiva/sclera: Conjunctivae normal.  Cardiovascular:     Rate and Rhythm: Normal rate and regular rhythm.     Heart sounds: Normal heart sounds.  Pulmonary:     Effort: Pulmonary effort is normal. No respiratory distress.  Lymphadenopathy:     Comments: Tender but non  erythematous nondraining both in the left axilla.  There is no cervical supraclavicular or groin adenopathy noted.  Skin:    Findings: No lesion or rash.  Neurological:     General: No focal deficit present.     Mental Status: He is alert and oriented to person, place, and time.  Psychiatric:        Mood and Affect: Mood normal.        Speech: Speech normal.        Behavior: Behavior normal.        Thought Content: Thought content normal.        Judgment: Judgment normal.       No results found for any visits on 07/05/22.  Assessment & Plan     1. Abscess of axilla, left Areas prepped with Betadine and anesthesia with lidocaine.  11 blade was used to incise the area and a good bit of sebum with some pus is expressed.  Patient got good relief from the pain.   I think this was a mildly infected sebaceous cyst but with the weight loss the wife is concerned about this being an axillary mass  and the patient with history of  cancer.  At this time will refer to surgery for consideration of possible biopsy.  She and her wife are both comfortable with this plan. - Ambulatory referral to General Surgery   No follow-ups on file.      I, Wilhemena Durie, MD, have reviewed all documentation for this visit. The documentation on 07/06/22 for the exam, diagnosis, procedures, and orders are all accurate and complete.    Zyshawn Bohnenkamp Cranford Mon, MD  [facility] [phone] (phone) [phone] (fax)  [place]

## 2022-07-08 ENCOUNTER — Other Ambulatory Visit: Payer: Self-pay

## 2022-07-08 ENCOUNTER — Encounter: Payer: Self-pay | Admitting: Surgery

## 2022-07-08 ENCOUNTER — Other Ambulatory Visit: Payer: Self-pay | Admitting: Surgery

## 2022-07-08 ENCOUNTER — Ambulatory Visit (INDEPENDENT_AMBULATORY_CARE_PROVIDER_SITE_OTHER): Payer: Medicare Other | Admitting: Surgery

## 2022-07-08 VITALS — BP 161/98 | HR 81 | Temp 98.6°F | Ht 74.0 in | Wt 189.4 lb

## 2022-07-08 DIAGNOSIS — L720 Epidermal cyst: Secondary | ICD-10-CM

## 2022-07-08 DIAGNOSIS — L723 Sebaceous cyst: Secondary | ICD-10-CM

## 2022-07-08 NOTE — Patient Instructions (Signed)
We have removed a Cyst in our office today.  You have sutures under the skin that will dissolve and also dermabond (skin glue) on top of your skin which will come off on it's own in 10-14 days.  You may use Ibuprofen or Tylenol as needed for pain control. Use the ice pack 3-4 times a day for the next two days for any achiness.  You may shower tomorrow, do not scrub at the area.  Avoid Strenuous activities that will make you sweat during the next 48 hours to avoid the glue coming off prematurely. Avoid activities that will place pressure to this area of the body for 1-2 weeks to avoid re-injury to incision site.  Please see your follow-up appointment provided. We will see you back in office to make sure this area is healed and to review the final pathology. If you have any questions or concerns prior to this appointment, call our office and speak with a nurse.    Excision of Skin Cysts or Lesions Excision of a skin lesion refers to the removal of a section of skin by making small cuts (incisions) in the skin. This procedure may be done to remove a cancerous (malignant) or noncancerous (benign) growth on the skin. It is typically done to treat or prevent cancer or infection. It may also be done to improve cosmetic appearance. The procedure may be done to remove: Cancerous growths, such as basal cell carcinoma, squamous cell carcinoma, or melanoma. Noncancerous growths, such as a cyst or lipoma. Growths, such as moles or skin tags, which may be removed for cosmetic reasons.  Various excision or surgical techniques may be used depending on your condition, the location of the lesion, and your overall health. Tell a health care provider about: Any allergies you have. All medicines you are taking, including vitamins, herbs, eye drops, creams, and over-the-counter medicines. Any problems you or family members have had with anesthetic medicines. Any blood disorders you have. Any surgeries you have  had. Any medical conditions you have. Whether you are pregnant or may be pregnant. What are the risks? Generally, this is a safe procedure. However, problems may occur, including: Bleeding. Infection. Scarring. Recurrence of the cyst, lipoma, or cancer. Changes in skin sensation or appearance, such as discoloration or swelling. Reaction to the anesthetics. Allergic reaction to surgical materials or ointments. Damage to nerves, blood vessels, muscles, or other structures. Continued pain.  What happens before the procedure? Ask your health care provider about: Changing or stopping your regular medicines. This is especially important if you are taking diabetes medicines or blood thinners. Taking medicines such as aspirin and ibuprofen. These medicines can thin your blood. Do not take these medicines before your procedure if your health care provider instructs you not to. You may be asked to take certain medicines. You may be asked to stop smoking. You may have an exam or testing. What happens during the procedure? To reduce your risk of infection: Your health care team will wash or sanitize their hands. Your skin will be washed with soap. You will be given a medicine to numb the area (local anesthetic). One of the following excision techniques will be performed. At the end of any of these procedures, antibiotic ointment will be applied as needed. Each of the following techniques may vary among health care providers and hospitals. Complete Surgical Excision The area of skin that needs to be removed will be marked with a pen. Using a small scalpel or scissors, the surgeon   will gently cut around and under the lesion until it is completely removed. The lesion will be placed in a fluid and sent to the lab for examination. If necessary, bleeding will be controlled with a device that delivers heat (electrocautery). The edges of the wound may be stitched (sutured) together, and a bandage  (dressing) will be applied. This procedure may be performed to treat a cancerous growth or a noncancerous cyst or lesion. Excision of a Cyst The surgeon will make an incision on the cyst. The entire cyst will be removed through the incision. The incision may be closed with sutures.  What happens after the procedure? Return to your normal activities as told by your health care provider.  

## 2022-07-08 NOTE — Progress Notes (Addendum)
Patient ID: David Trevino, male   DOB: 03-Jul-1946, 76 y.o.   MRN: [id number]  Chief Complaint: Left axillary cyst  History of Present Illness David Trevino is a 76 y.o. male with a recent history of incision and drainage of a left axillary mass that appeared to be an inflammatory cyst.  No recent treatment with antibiotics, no packing changes.  No persistent drainage from the site, it appears to be well-healed per report.  Past Medical History Past Medical History:  Diagnosis Date   Colon cancer ([place])    Hyperlipidemia    Personal history of tobacco use, presenting hazards to health 09/17/2015      Past Surgical History:  Procedure Laterality Date   CATARACT EXTRACTION Bilateral    was done in 2 different months but unsure of which months.    COLECTOMY  2007   S/P right and transverse colectomy with anastomosis of the ileal and descending colon for a T1   COLON SURGERY     removal of partial colon   HERNIA REPAIR  08/2007   HERNIA REPAIR Right    lower right   RETINAL DETACHMENT SURGERY Right 11/2010   TONSILLECTOMY AND ADENOIDECTOMY  1960    No Known Allergies  Current Outpatient Medications  Medication Sig Dispense Refill   aspirin-acetaminophen-caffeine (EXCEDRIN MIGRAINE) 250-250-65 MG tablet Take 1 tablet by mouth every 6 (six) hours as needed for headache.     Cholecalciferol (VITAMIN D) 2000 UNITS tablet Take 2,000 Units by mouth daily.      ipratropium (ATROVENT) 0.03 % nasal spray Place 2 sprays into both nostrils every 12 (twelve) hours. 30 mL 12   levothyroxine (SYNTHROID) 50 MCG tablet TAKE 1 TABLET BY MOUTH DAILY 90 tablet 0   loratadine (CLARITIN) 10 MG tablet TAKE 1 TABLET(10 MG) BY MOUTH DAILY 90 tablet 1   Omega-3 1000 MG CAPS Take by mouth daily.      No current facility-administered medications for this visit.    Family History Family History  Problem Relation Age of Onset   Arthritis Mother    Diabetes Mother    Chronic Renal Failure  Mother    Cancer Father        bladder   Kidney disease Father    Bladder Cancer Father    Chronic Renal Failure Father    Allergies Sister    Cancer Brother        prostate      Social History Social History   Tobacco Use   Smoking status: Former    Packs/day: 1.00    Years: 40.00    Total pack years: 40.00    Types: Cigarettes    Quit date: 11/21/2005    Years since quitting: 16.6   Smokeless tobacco: Never  Vaping Use   Vaping Use: Never used  Substance Use Topics   Alcohol use: No    Comment: Quit drinking alcohol 10/16/2005   Drug use: No        Review of Systems  Constitutional: Negative.   HENT: Negative.    Eyes: Negative.   Respiratory: Negative.    Cardiovascular: Negative.   Gastrointestinal: Negative.   Genitourinary: Negative.   Skin: Negative.   Neurological: Negative.   Psychiatric/Behavioral: Negative.        Physical Exam Blood pressure (!) 161/98, pulse 81, temperature 98.6 F (37 C), temperature source Oral, height '6\' 2"'$  (1.88 m), weight 189 lb 6.4 oz (85.9 kg), SpO2 97 %. Last Weight  Most recent  update: 07/08/2022  1:09 PM    Weight  85.9 kg (189 lb 6.4 oz)             CONSTITUTIONAL: Well developed, and nourished, appropriately responsive and aware without distress.   EYES: Sclera non-icteric.   EARS, NOSE, MOUTH AND THROAT:  The oropharynx is clear. Oral mucosa is pink and moist.     Hearing is intact to voice.  NECK: Trachea is midline, and there is no jugular venous distension.  LYMPH NODES:  Lymph nodes in the neck are not enlarged. RESPIRATORY:   Normal respiratory effort without pathologic use of accessory muscles. CARDIOVASCULAR: Heart is regular in rate and rhythm. GI: The abdomen is  soft, nontender, and nondistended. There were no palpable masses. I did not appreciate hepatosplenomegaly. There were normal bowel sounds. MUSCULOSKELETAL:  Symmetrical muscle tone appreciated in all four extremities.    SKIN: Skin turgor  is normal. No pathologic skin lesions appreciated.  Adjacent to the recent I&D site, there appears to be a small comedone/in grown hair follicle and an underlying apparently well defined spherical lesion in the soft tissues of the axilla.  It may be somewhat tethered to the adjacent soft tissue is hard to tell but it appears to be discrete, 2 cm lesion, and well-defined from the immediate subcutaneous tissues. NEUROLOGIC:  Motor and sensation appear grossly normal.  Cranial nerves are grossly without defect. PSYCH:  Alert and oriented to person, place and time. Affect is appropriate for situation.  Data Reviewed I have personally reviewed what is currently available of the patient's imaging, recent labs and medical records.   Labs:     Latest Ref Rng & Units 04/21/2021   10:53 AM 04/15/2020   10:50 AM 04/11/2019    3:19 PM  CBC  WBC 3.4 - 10.8 x10E3/uL 6.4  5.6  6.1   Hemoglobin 13.0 - 17.7 g/dL 15.9  15.8  15.5   Hematocrit 37.5 - 51.0 % 47.0  47.0  44.2   Platelets 150 - 450 x10E3/uL 269  260  271       Latest Ref Rng & Units 11/03/2021    3:11 PM 04/21/2021   10:53 AM 04/15/2020   10:50 AM  CMP  Glucose 70 - 99 mg/dL 103  120  119   BUN 8 - 27 mg/dL '18  16  17   '$ Creatinine 0.76 - 1.27 mg/dL 1.03  0.93  0.95   Sodium 134 - 144 mmol/L 139  138  143   Potassium 3.5 - 5.2 mmol/L 4.2  4.3  4.4   Chloride 96 - 106 mmol/L 102  103  105   CO2 20 - 29 mmol/L '23  19  20   '$ Calcium 8.6 - 10.2 mg/dL 9.4  9.0  8.8   Total Protein 6.0 - 8.5 g/dL  7.6  7.2   Total Bilirubin 0.0 - 1.2 mg/dL  0.6  0.4   Alkaline Phos 44 - 121 IU/L  88  90   AST 0 - 40 IU/L  18  16   ALT 0 - 44 IU/L  20  16       Imaging:  Within last 24 hrs: No results found.  Assessment    Dermal inclusion cyst, 2 cm/resolving cellulitis abscess left axilla. 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 Excision left axillary dermal cyst completed under local anesthesia, consent obtained prior to procedure.  Area prepped with ChloraPrep, local infiltration of 1% lidocaine with epinephrine.  Elliptical excision of the 2 cm lesion in question, dissection from the adjacent soft tissues completed.  Adequate hemostasis obtained with pressure.  Incision closed with interrupted 3-0 Vicryl sutures.  Dermabond applied to help seal incision. Patient instructions below given. As incision was closed with Dermabond.  It is best to keep it clean and dry, it will tolerate a brief shower, but do not soak it or apply any creams or lotions to the incisions.  The Dermabond should gradually flake off over time.  Keep it open to air so you can evaluate your incisions.  Dermabond assists the underlying sutures to keep your incision closed and protected from infection.  Should you develop some drainage from your incision, some drops of drainage would be okay but if it persists continue to put keep a dry dressing over it.   Face-to-face time spent with the patient and accompanying care providers(if present) was 50 minutes, with more than 50% of the time spent counseling, educating, and coordinating care of the patient.    These notes generated with voice recognition software. I apologize for typographical errors.  Ronny Bacon M.D., FACS 07/08/2022, 3:31 PM

## 2022-07-15 ENCOUNTER — Ambulatory Visit (INDEPENDENT_AMBULATORY_CARE_PROVIDER_SITE_OTHER): Payer: Medicare Other | Admitting: Surgery

## 2022-07-15 ENCOUNTER — Encounter: Payer: Self-pay | Admitting: Surgery

## 2022-07-15 VITALS — BP 149/90 | HR 71 | Temp 98.1°F | Wt 190.6 lb

## 2022-07-15 DIAGNOSIS — L723 Sebaceous cyst: Secondary | ICD-10-CM

## 2022-07-15 DIAGNOSIS — L720 Epidermal cyst: Secondary | ICD-10-CM

## 2022-07-15 NOTE — Patient Instructions (Signed)
If you have any concerns or questions, please feel free to call our office. Follow up as needed.   Excision of Skin Lesions, Care After The following information offers guidance on how to care for yourself after your procedure. Your health care provider may also give you more specific instructions. If you have problems or questions, contact your health care provider. What can I expect after the procedure? After your procedure, it is common to have: Soreness or mild pain. Some redness and swelling. Follow these instructions at home: Excision site care  Follow instructions from your health care provider about how to take care of your excision site. Make sure you: Wash your hands with soap and water for at least 20 seconds before and after you change your bandage (dressing). If soap and water are not available, use hand sanitizer. Change your dressing as told by your health care provider. Leave stitches (sutures), skin glue, or adhesive strips in place. These skin closures may need to stay in place for 2 weeks or longer. If adhesive strip edges start to loosen and curl up, you may trim the loose edges. Do not remove adhesive strips completely unless your health care provider tells you to do that. Check the excision area every day for signs of infection. Watch for: More redness, swelling, or pain. Fluid or blood. Warmth. Pus or a bad smell. Keep the site clean, dry, and protected for at least 48 hours. For bleeding, apply gentle but firm pressure to the area using a folded towel for 20 minutes. Do not take baths, swim, or use a hot tub until your health care provider approves. Ask your health care provider if you may take showers. You may only be allowed to take sponge baths. General instructions Take over-the-counter and prescription medicines only as told by your health care provider. Follow instructions from your health care provider about how to minimize scarring. Scarring should lessen over  time. Avoid sun exposure until the area has healed. Use sunscreen to protect the area from the sun after it has healed. Avoid high-impact exercise and activities until the sutures are removed or the area heals. Keep all follow-up visits. This is important. Contact a health care provider if: You have more redness, swelling, or pain around your excision site. You have fluid or blood coming from your excision site. Your excision site feels warm to the touch. You have pus or a bad smell coming from your excision site. You have a fever. You have pain that does not improve in 2-3 days after your procedure. Get help right away if: You have bleeding that does not stop with pressure or a dressing. Your wound opens up. Summary Take over-the-counter and prescription medicines only as told by your health care provider. Change your dressing as told by your health care provider. Contact a health care provider if you have redness, swelling, pain, or other signs of infection around your excision site. Keep all follow-up visits. This is important. This information is not intended to replace advice given to you by your health care provider. Make sure you discuss any questions you have with your health care provider. Document Revised: 05/26/2021 Document Reviewed: 05/26/2021 Elsevier Patient Education  [place].

## 2022-07-19 NOTE — Progress Notes (Signed)
[facility] POST-OP OFFICE VISIT  DOS: 07/15/2022  HPI: David Trevino is a 76 y.o. male 1 week s/p excision of residual/complicated dermal cyst left axilla.  He denies any discharge.  Reports the measures he is taken to ensure were maintenance of the Dermabond closure.  No complaints of pain, fevers or chills.  Vital signs: BP (!) 149/90   Pulse 71   Temp 98.1 F (36.7 C) (Oral)   Wt 190 lb 9.6 oz (86.5 kg)   SpO2 97%   BMI 24.47 kg/m    Physical Exam: Constitutional: Appears well  Skin: Scar in left axilla well covered with Dermabond.  Anticipated dermal subcutaneous thickening, without evidence of erythema or induration.  Assessment/Plan: This is a 76 y.o. male 1 week days s/p left axillary dermal cyst excision.  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  We will be glad to see him back as needed.   David Trevino M.D., FACS 07/19/2022, 12:18 PM

## 2022-07-24 ENCOUNTER — Other Ambulatory Visit: Payer: Self-pay | Admitting: Family Medicine

## 2022-07-24 DIAGNOSIS — E039 Hypothyroidism, unspecified: Secondary | ICD-10-CM

## 2022-07-26 NOTE — Telephone Encounter (Signed)
Requested Prescriptions  Pending Prescriptions Disp Refills  . levothyroxine (SYNTHROID) 50 MCG tablet [Pharmacy Med Name: LEVOTHYROXINE 0.'05MG'$  (50MCG) TAB] 90 tablet 0    Sig: TAKE 1 TABLET BY MOUTH DAILY     Endocrinology:  Hypothyroid Agents Passed - 07/24/2022  3:38 AM      Passed - TSH in normal range and within 360 days    TSH  Date Value Ref Range Status  11/03/2021 1.290 0.450 - 4.500 uIU/mL Final         Passed - Valid encounter within last 12 months    Recent Outpatient Visits          3 weeks ago Abscess of axilla, left   [facility] [name], MD   2 months ago Adult hypothyroidism   [facility] [name], MD   8 months ago Hyperlipidemia, unspecified hyperlipidemia type   [facility], Jake Church, DO   1 year ago Encounter for [employer] annual wellness exam   [facility] [name], MD   2 years ago Hyperlipidemia, unspecified hyperlipidemia type   [facility] [name], MD      Future Appointments            In 3 months [name], MD [facility], [place]

## 2022-10-22 ENCOUNTER — Telehealth: Payer: Self-pay | Admitting: Family Medicine

## 2022-10-22 ENCOUNTER — Other Ambulatory Visit: Payer: Self-pay | Admitting: Family Medicine

## 2022-10-22 DIAGNOSIS — E039 Hypothyroidism, unspecified: Secondary | ICD-10-CM

## 2022-10-22 DIAGNOSIS — R0982 Postnasal drip: Secondary | ICD-10-CM

## 2022-10-22 MED ORDER — LORATADINE 10 MG PO TABS: ORAL_TABLET | ORAL | 0 refills | Status: AC

## 2022-10-22 MED ORDER — LEVOTHYROXINE SODIUM 50 MCG PO TABS: 50.0000 ug | ORAL_TABLET | Freq: Every day | ORAL | 0 refills | Status: DC

## 2022-10-22 NOTE — Telephone Encounter (Signed)
[place] faxed refill request for the following medications:   loratadine (CLARITIN) 10 MG tablet     levothyroxine (SYNTHROID) 50 MCG tablet   Please advise

## 2022-11-16 ENCOUNTER — Encounter: Payer: Medicare Other | Admitting: Family Medicine

## 2022-11-19 ENCOUNTER — Other Ambulatory Visit: Payer: Self-pay | Admitting: Family Medicine

## 2022-11-19 DIAGNOSIS — E039 Hypothyroidism, unspecified: Secondary | ICD-10-CM

## 2023-02-09 DIAGNOSIS — J449 Chronic obstructive pulmonary disease, unspecified: Secondary | ICD-10-CM | POA: Diagnosis not present

## 2023-02-09 DIAGNOSIS — R079 Chest pain, unspecified: Secondary | ICD-10-CM | POA: Diagnosis not present

## 2023-02-09 DIAGNOSIS — R7303 Prediabetes: Secondary | ICD-10-CM | POA: Diagnosis not present

## 2023-02-09 DIAGNOSIS — Z87891 Personal history of nicotine dependence: Secondary | ICD-10-CM | POA: Diagnosis not present

## 2023-02-09 DIAGNOSIS — R053 Chronic cough: Secondary | ICD-10-CM | POA: Diagnosis not present

## 2023-02-09 DIAGNOSIS — E782 Mixed hyperlipidemia: Secondary | ICD-10-CM | POA: Diagnosis not present

## 2023-02-09 DIAGNOSIS — M546 Pain in thoracic spine: Secondary | ICD-10-CM | POA: Diagnosis not present

## 2023-02-09 DIAGNOSIS — G8929 Other chronic pain: Secondary | ICD-10-CM | POA: Diagnosis not present

## 2023-02-09 DIAGNOSIS — Z85038 Personal history of other malignant neoplasm of large intestine: Secondary | ICD-10-CM | POA: Diagnosis not present

## 2023-02-09 DIAGNOSIS — E039 Hypothyroidism, unspecified: Secondary | ICD-10-CM | POA: Diagnosis not present

## 2023-02-16 ENCOUNTER — Other Ambulatory Visit: Payer: Self-pay | Admitting: Family Medicine

## 2023-02-16 DIAGNOSIS — E039 Hypothyroidism, unspecified: Secondary | ICD-10-CM

## 2023-04-28 DIAGNOSIS — R399 Unspecified symptoms and signs involving the genitourinary system: Secondary | ICD-10-CM | POA: Diagnosis not present

## 2023-04-28 DIAGNOSIS — N410 Acute prostatitis: Secondary | ICD-10-CM | POA: Diagnosis not present

## 2023-04-28 DIAGNOSIS — N3001 Acute cystitis with hematuria: Secondary | ICD-10-CM | POA: Diagnosis not present

## 2023-05-31 DIAGNOSIS — R7303 Prediabetes: Secondary | ICD-10-CM | POA: Diagnosis not present

## 2023-05-31 DIAGNOSIS — N410 Acute prostatitis: Secondary | ICD-10-CM | POA: Diagnosis not present

## 2023-05-31 DIAGNOSIS — R399 Unspecified symptoms and signs involving the genitourinary system: Secondary | ICD-10-CM | POA: Diagnosis not present

## 2023-06-29 DIAGNOSIS — R7303 Prediabetes: Secondary | ICD-10-CM | POA: Diagnosis not present

## 2023-06-29 DIAGNOSIS — Z85038 Personal history of other malignant neoplasm of large intestine: Secondary | ICD-10-CM | POA: Diagnosis not present

## 2023-06-29 DIAGNOSIS — Z87891 Personal history of nicotine dependence: Secondary | ICD-10-CM | POA: Diagnosis not present

## 2023-06-29 DIAGNOSIS — Z0000 Encounter for general adult medical examination without abnormal findings: Secondary | ICD-10-CM | POA: Diagnosis not present

## 2023-06-29 DIAGNOSIS — E039 Hypothyroidism, unspecified: Secondary | ICD-10-CM | POA: Diagnosis not present

## 2023-06-29 DIAGNOSIS — N410 Acute prostatitis: Secondary | ICD-10-CM | POA: Diagnosis not present

## 2023-07-12 ENCOUNTER — Ambulatory Visit (INDEPENDENT_AMBULATORY_CARE_PROVIDER_SITE_OTHER): Payer: Medicare Other | Admitting: Urology

## 2023-07-12 ENCOUNTER — Encounter: Payer: Self-pay | Admitting: Urology

## 2023-07-12 VITALS — BP 139/88 | HR 81 | Ht 74.0 in | Wt 196.0 lb

## 2023-07-12 DIAGNOSIS — N138 Other obstructive and reflux uropathy: Secondary | ICD-10-CM | POA: Diagnosis not present

## 2023-07-12 DIAGNOSIS — Z87438 Personal history of other diseases of male genital organs: Secondary | ICD-10-CM | POA: Diagnosis not present

## 2023-07-12 DIAGNOSIS — N411 Chronic prostatitis: Secondary | ICD-10-CM

## 2023-07-12 DIAGNOSIS — N401 Enlarged prostate with lower urinary tract symptoms: Secondary | ICD-10-CM | POA: Diagnosis not present

## 2023-07-12 NOTE — Progress Notes (Signed)
07/12/23 2:04 PM   David Trevino January 13, 1946 [id number]  CC: Recurrent UTI/prostatitis  HPI: 77 year old male who I actually saw for similar issues in 2020 and 2021.  He reports 3 culture documented E. coli UTIs over the last 3 months.  Symptoms have included rigors/chills and dysuria.  He was originally treated with doxycycline without any improvement, then Bactrim without significant improvement after 1 to 2 weeks, and Bactrim was refilled for a total of a 6-week course.  He denies any symptoms today and feels like things have improved.  He previously underwent evaluation with me in 2021 with a normal cystoscopy that showed moderate bladder trabeculations as well as a CT showing no abnormalities, and prostate measured 36 g.  PVRs have been normal.  He denies any significant urinary symptoms when he does not have an infection.  No recent PSA values to review, most recent PSA was normal at 1.5 from 2016.   PMH: Past Medical History:  Diagnosis Date   Colon cancer ([facility])    Hyperlipidemia    Personal history of tobacco use, presenting hazards to health 09/17/2015    Surgical History: Past Surgical History:  Procedure Laterality Date   CATARACT EXTRACTION Bilateral    was done in 2 different months but unsure of which months.    COLECTOMY  2007   S/P right and transverse colectomy with anastomosis of the ileal and descending colon for a T1   COLON SURGERY     removal of partial colon   HERNIA REPAIR  08/2007   HERNIA REPAIR Right    lower right   RETINAL DETACHMENT SURGERY Right 11/2010   TONSILLECTOMY AND ADENOIDECTOMY  1960    Family History: Family History  Problem Relation Age of Onset   Arthritis Mother    Diabetes Mother    Chronic Renal Failure Mother    Cancer Father        bladder   Kidney disease Father    Bladder Cancer Father    Chronic Renal Failure Father    Allergies Sister    Cancer Brother        prostate    Social History:  reports that he  quit smoking about 17 years ago. His smoking use included cigarettes. He started smoking about 57 years ago. He has a 40 pack-year smoking history. He has been exposed to tobacco smoke. He has never used smokeless tobacco. He reports that he does not drink alcohol and does not use drugs.  Physical Exam: BP 139/88   Pulse 81   Ht 6\' 2"  (1.88 m)   Wt 196 lb (88.9 kg)   BMI 25.16 kg/m    Constitutional:  Alert and oriented, No acute distress. Cardiovascular: No clubbing, cyanosis, or edema. Respiratory: Normal respiratory effort, no increased work of breathing.  Assessment & Plan:   77 year old male with recurrent episodes of prostatitis, currently with improved symptoms after a 6-week course of Bactrim.  Prior workup in 2021 with cystoscopy and CT was benign, and showed a 36 g prostate with moderate trabeculations.  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  He is very hesitant to consider surgery at this time.  We discussed the risks and benefits of HoLEP at length.  The procedure requires general anesthesia and takes 1 to 2 hours, and a holmium laser is used to enucleate the prostate and push this tissue into the bladder.  A morcellator is then used to remove this tissue, which is sent for pathology.  The vast majority(>95%) of patients are able to discharge the same day with a catheter in place for 2 to 3 days, and will follow-up in clinic for a voiding trial.  We specifically discussed the risks of bleeding, infection, retrograde ejaculation, temporary urgency and urge incontinence, very low risk of long-term incontinence, urethral stricture/bladder neck contracture, pathologic evaluation of prostate tissue and possible detection of prostate cancer or other malignancy, and possible need for additional procedures.  Complete course of Bactrim as prescribed by  PCP Start cranberry tablets twice daily RTC 2 to 3 months symptom check, rediscuss HOLEP.  Would strongly recommend HOLEP if recurrent infections despite above strategies   Legrand Rams, MD 07/12/2023  [facility] [address] [place], [place] [address] [phone]

## 2023-07-12 NOTE — Patient Instructions (Signed)
Holmium Laser Enucleation of the Prostate (HoLEP)  HoLEP is a treatment for men with benign prostatic hyperplasia (BPH). The laser surgery removed blockages of urine flow, and is done without any incisions on the body.     What is HoLEP?  HoLEP is a type of laser surgery used to treat obstruction (blockage) of urine flow as a result of benign prostatic hyperplasia (BPH). In men with BPH, the prostate gland is not cancerous, but has become enlarged. An enlarged prostate can result in a number of urinary tract symptoms such as weak urinary stream, difficulty in starting urination, inability to urinate, frequent urination, or getting up at night to urinate.  HoLEP was developed in the 1990's as a more effective and less expensive surgical option for BPH, compared to other surgical options such as laser vaporization(PVP/greenlight laser), transurethral resection of the prostate(TURP), and open simple prostatectomy.   What happens during a HoLEP?  HoLEP requires general anesthesia ("asleep" throughout the procedure).   An antibiotic is given to reduce the risk of infection  A surgical instrument called a resectoscope is inserted through the urethra (the tube that carries urine from the bladder). The resectoscope has a camera that allows the surgeon to view the internal structure of the prostate gland, and to see where the incisions are being made during surgery.  The laser is inserted into the resectoscope and is used to enucleate (free up) the enlarged prostate tissue from the capsule (outer shell) and then to seal up any blood vessels. The tissue that has been removed is pushed back into the bladder.  A morcellator is placed through the resectoscope, and is used to suction out the prostate tissue that has been pushed into the bladder.  When the prostate tissue has been removed, the resectoscope is removed, and a foley catheter is placed to allow healing and drain the urine from the  bladder.     What happens after a HoLEP?  More than 90% of patients go home the same day a few hours after surgery. Less than 10% will be admitted to the hospital overnight for observation to monitor the urine, or if they have other medical problems.  Fluid is flushed through the catheter for about 1 hour after surgery to clear any blood from the urine. It is normal to have some blood in the urine after surgery. The need for blood transfusion is extremely rare.  Eating and drinking are permitted after the procedure once the patient has fully awakened from anesthesia.  The catheter is usually removed 2-3 days after surgery- the patient will come to clinic to have the catheter removed and make sure they can urinate on their own.  It is very important to drink lots of fluids after surgery for one week to keep the bladder flushed.  At first, there may be some burning with urination, but this typically improved within a few hours to days. Most patients do not have a significant amount of pain, and narcotic pain medications are rarely needed.  Symptoms of urinary frequency, urgency, and even leakage are NORMAL for the first few weeks after surgery as the bladder adjusts after having to work hard against blockage from the prostate for many years. This will improve, but can sometimes take several months.  The use of pelvic floor exercises (Kegel exercises) can help improve problems with urinary incontinence.   After catheter removal, patients will be seen at 6 weeks and 6 months for symptom check  No heavy lifting for  at least 2-3 weeks after surgery, however patients can walk and do light activities the first day after surgery. Return to work time depends on occupation.    What are the advantages of HoLEP?  HoLEP has been studied in many different parts of the world and has been shown to be a safe and effective procedure. Although there are many types of BPH surgeries available, HoLEP offers a  unique advantage in being able to remove a large amount of tissue without any incisions on the body, even in very large prostates, while decreasing the risk of bleeding and providing tissue for pathology (to look for cancer). This decreases the need for blood transfusions during surgery, minimizes hospital stay, and reduces the risk of needing repeat treatment.  What are the side effects of HoLEP?  Temporary burning and bleeding during urination. Some blood may be seen in the urine for weeks after surgery and is part of the healing process.  Urinary incontinence (inability to control urine flow) is expected in all patients immediately after surgery and they should wear pads for the first few days/weeks. This typically improves over the course of several weeks. Performing Kegel exercises can help decrease leakage from stress maneuvers such as coughing, sneezing, or lifting. The rate of long term leakage is very low. Patients may also have leakage with urgency and this may be treated with medication. The risk of urge incontinence can be dependent on several factors including age, prostate size, symptoms, and other medical problems.  Retrograde ejaculation or "backwards ejaculation." In 75% of cases, the patient will not see any fluid during ejaculation after surgery.  Erectile function is generally not significantly affected.   What are the risks of HoLEP?  Injury to the urethra or development of scar tissue at a later date  Injury to the capsule of the prostate (typically treated with longer catheterization).  Injury to the bladder or ureteral orifices (where the urine from the kidney drains out)  Infection of the bladder, testes, or kidneys  Return of urinary obstruction at a later date requiring another operation (<2%)  Need for blood transfusion or re-operation due to bleeding  Failure to relieve all symptoms and/or need for prolonged catheterization after surgery  5-15% of patients are  found to have previously undiagnosed prostate cancer in their specimen. Prostate cancer can be treated after HoLEP.  Standard risks of anesthesia including blood clots, heart attacks, etc  When should I call my doctor?  Fever over 101.3 degrees  Inability to urinate, or large blood clots in the urine    Prostatitis  Prostatitis is swelling or inflammation of the prostate gland, also called the prostate. This gland is about 1.5 inches wide and 1 inch high, and it is involved in making semen. The prostate is located below a man's bladder, in front of the rectum. There are four types of prostatitis: Chronic prostatitis (CP), also called chronic pelvic pain syndrome (CPPS). This is the most common type of prostatitis. It is associated with increased muscle tone in the area between the hip bones (pelvic area), around the prostate. This type is also known as a pelvic floor disorder. Chronic bacterial prostatitis. This type usually results from an acute bacterial infection in the prostate gland that keeps coming back or has not been treated properly. The symptoms are less severe than those caused by acute bacterial prostatitis, which lasts a shorter time. Asymptomatic inflammatory prostatitis. This type does not have symptoms and does not need treatment. This is diagnosed  when tests are done for other disorders of the urinary tract or reproductive tract. Acute bacterial prostatitis. This type starts quickly and results from an acute bacterial infection in the prostate gland. It is usually associated with a bladder infection, high fever, and chills. This is the least common type of prostatitis. What are the causes? Bacterial prostatitis is caused by an infection from bacteria. Chronic nonbacterial prostatitis may be caused by: Factors related to the nervous system. This system includes thebrain, spinal cord, and nerves. An autoimmune response. This happens when the body's disease-fighting system  attacks healthy tissue in the body by mistake. Psychological factors. These have to do with how the mind works. The causes of the other types of prostatitis are usually not known. What are the signs or symptoms? Symptoms of this condition depend on the type of prostatitis you have. Acute bacterial prostatitis Symptoms may include: Pain or burning during urination. Frequent and sudden urges to urinate. Trouble starting to urinate. Fever. Chills. Pain in your muscles or joints, lower back, or lower abdomen. Other types of prostatitis Symptoms may include: Sudden urges to urinate, or urinating often. Trouble starting to urinate. Weak urine stream. Dribbling after urination. Discharge coming from the penis. Pain in the testicles, the penis, or the tip of the penis. Pain in the area in front of the rectum and below the scrotum (perineum). Pain when ejaculating. How is this diagnosed? This condition may be diagnosed based on: A physical and medical exam. A digital rectal exam. For this, the health care provider may use a finger to feel the prostate. A urine test to check for bacteria. A semen sample or blood tests. Ultrasound. Urodynamic tests to check how your body handles urine. Cystoscopy to look inside your bladder or inside the part of your body that drains urine from the bladder (urethra). How is this treated? Treatment for this condition depends on the type of prostatitis. Treatment may involve: Medicines to relieve pain or inflammation, or to help relax your muscles. Physical therapy. Heat therapy. Biofeedback. These techniques help you control certain body functions. Relaxation exercises. Antibiotic medicine, if your condition is caused by bacteria. Sitz baths. These warm water baths help to relax your pelvic floor muscles, which helps to relieve pressure on the prostate. Follow these instructions at home: Medicines Take over-the-counter and prescription medicines only  as told by your health care provider. If you were prescribed an antibiotic medicine, take it as told by your health care provider. Do not stop using the antibiotic even if you start to feel better. Managing pain and swelling  Take sitz baths as directed by your health care provider. For a sitz bath, sit in warm water that is deep enough to cover your hips and buttocks. If directed, apply heat to the affected area as often as told by your health care provider. Use the heat source that your health care provider recommends, such as a moist heat pack or a heating pad. Place a towel between your skin and the heat source. Leave the heat on for 20-30 minutes. Remove the heat if your skin turns bright red. This is especially important if you are unable to feel pain, heat, or cold. You may have a greater risk of getting burned. General instructions Do exercises as told by your health care provider, if you were prescribed physical therapy, biofeedback, or relaxation exercises. Keep all follow-up visits as told by your health care provider. This is important. Where to find more information [employer] of  Diabetes and Digestive and Kidney Diseases: [email or web address] Contact a health care provider if: Your symptoms get worse. You have a fever. Get help right away if: You have chills. You feel light-headed or feel like you may faint. You cannot urinate. You have blood or blood clots in your urine. Summary Prostatitis is swelling or inflammation of the prostate gland. Treatment for this condition depends on the type of prostatitis. Take over-the-counter and prescription medicines only as told by your health care provider. Get help right away of you have chills, feel light-headed, feel like you may faint, cannot urinate, or have blood or blood clots in your urine. This information is not intended to replace advice given to you by your health care provider. Make sure you discuss any  questions you have with your health care provider. Document Revised: 09/09/2022 Document Reviewed: 09/09/2022 Elsevier Patient Education  2024 [employer].

## 2023-10-11 ENCOUNTER — Encounter: Payer: Self-pay | Admitting: Urology

## 2023-10-11 ENCOUNTER — Other Ambulatory Visit
Admission: RE | Admit: 2023-10-11 | Discharge: 2023-10-11 | Disposition: A | Discharge status: Home / Self Care | Payer: Medicare Other | Attending: Urology | Admitting: Urology

## 2023-10-11 ENCOUNTER — Ambulatory Visit: Payer: Medicare Other | Admitting: Urology

## 2023-10-11 VITALS — BP 147/82 | HR 83 | Ht 74.0 in | Wt 200.0 lb

## 2023-10-11 DIAGNOSIS — R319 Hematuria, unspecified: Secondary | ICD-10-CM

## 2023-10-11 DIAGNOSIS — N411 Chronic prostatitis: Secondary | ICD-10-CM | POA: Insufficient documentation

## 2023-10-11 DIAGNOSIS — R82998 Other abnormal findings in urine: Secondary | ICD-10-CM | POA: Diagnosis not present

## 2023-10-11 DIAGNOSIS — N390 Urinary tract infection, site not specified: Secondary | ICD-10-CM | POA: Insufficient documentation

## 2023-10-11 LAB — URINALYSIS, COMPLETE (UACMP) WITH MICROSCOPIC
Bilirubin Urine: NEGATIVE
Glucose, UA: NEGATIVE mg/dL
Ketones, ur: NEGATIVE mg/dL
Nitrite: POSITIVE — AB
Protein, ur: NEGATIVE mg/dL
Specific Gravity, Urine: 1.02 (ref 1.005–1.030)
WBC, UA: >50 WBC/hpf (ref 0–5)
pH: 5.5 (ref 5.0–8.0)

## 2023-10-11 LAB — BLADDER SCAN AMB NON-IMAGING

## 2023-10-11 MED ORDER — CIPROFLOXACIN HCL 500 MG PO TABS: 500.0000 mg | ORAL_TABLET | Freq: Two times a day (BID) | ORAL | 0 refills | Status: AC

## 2023-10-11 NOTE — Progress Notes (Signed)
   10/11/2023 2:07 PM   David Trevino 03-31-1946 [id number]  Reason for visit: Follow up recurrent UTI/recurrent prostatitis  HPI: 77 year old male with recurrent UTIs/prostatitis.  I had seen him in 2020 for similar issues and CT urogram and cystoscopy were benign, prostate measured 36 g at that time.  He had improvement on cranberry tablet prophylaxis and had done well for a few years.  Starting in June 2024 he has had multiple recurrent infections despite multiple courses of antibiotics including doxycycline and most recently a 6-week course of Bactrim.  He currently reports urinary symptoms of weak stream, foul-smelling urine, intermittent urine stream, and intermittent dysuria.  PVR is again normal today at 60ml.  Urine today again appears grossly infected with 6-10 RBC, greater than 50 WBC, many bacteria, large leukocytes, nitrite positive, WBC clumps present.  With his recurrent infections, I recommended a repeat CT urogram to evaluate for any nephrolithiasis or obstruction, we again discussed considering HOLEP as an option to remove prostate tissue and help with his recurrent infections but he would like to avoid surgery at this time if possible.  Cipro 500 mg twice daily x 4 weeks for prostatitis, follow-up cultures CT urogram for further evaluation, call with results  [name], MD  [facility] [address] [place], [place] [address] [phone]

## 2023-10-13 LAB — URINE CULTURE: Culture: >=100000 — AB

## 2023-10-20 ENCOUNTER — Ambulatory Visit
Admission: RE | Admit: 2023-10-20 | Discharge: 2023-10-20 | Disposition: A | Discharge status: Home / Self Care | Payer: Medicare Other | Source: Ambulatory Visit | Attending: Urology | Admitting: Urology

## 2023-10-20 DIAGNOSIS — N411 Chronic prostatitis: Secondary | ICD-10-CM | POA: Diagnosis not present

## 2023-10-20 DIAGNOSIS — R319 Hematuria, unspecified: Secondary | ICD-10-CM | POA: Diagnosis not present

## 2023-10-20 DIAGNOSIS — K4020 Bilateral inguinal hernia, without obstruction or gangrene, not specified as recurrent: Secondary | ICD-10-CM | POA: Diagnosis not present

## 2023-10-20 DIAGNOSIS — N390 Urinary tract infection, site not specified: Secondary | ICD-10-CM | POA: Insufficient documentation

## 2023-10-20 DIAGNOSIS — N200 Calculus of kidney: Secondary | ICD-10-CM | POA: Diagnosis not present

## 2023-10-20 DIAGNOSIS — D3502 Benign neoplasm of left adrenal gland: Secondary | ICD-10-CM | POA: Diagnosis not present

## 2023-10-20 LAB — POCT I-STAT CREATININE: Creatinine, Ser: 1.1 mg/dL (ref 0.61–1.24)

## 2023-10-20 MED ORDER — IOHEXOL 300 MG/ML  SOLN
100.0000 mL | Freq: Once | INTRAMUSCULAR | Status: AC | PRN
  Administered 2023-10-20: 100 mL via INTRAVENOUS

## 2024-02-02 ENCOUNTER — Ambulatory Visit: Payer: Medicare Other | Admitting: Urology

## 2024-02-02 ENCOUNTER — Encounter: Payer: Self-pay | Admitting: Urology

## 2024-02-02 VITALS — BP 116/69 | HR 82 | Ht 74.0 in | Wt 197.0 lb

## 2024-02-02 DIAGNOSIS — N411 Chronic prostatitis: Secondary | ICD-10-CM | POA: Diagnosis not present

## 2024-02-02 DIAGNOSIS — Z125 Encounter for screening for malignant neoplasm of prostate: Secondary | ICD-10-CM | POA: Diagnosis not present

## 2024-02-02 DIAGNOSIS — N390 Urinary tract infection, site not specified: Secondary | ICD-10-CM

## 2024-02-02 LAB — BLADDER SCAN AMB NON-IMAGING: Scan Result: 0

## 2024-02-02 NOTE — Patient Instructions (Signed)
 Continue cranberry tablets 1-2 times daily to help with UTI/prostatitis prevention  If you have recurrent infections, I would recommend looking in your bladder again in clinic(cystoscopy).  If you continue to have infections you may benefit from a procedure called a HOLEP to remove most of the prostate tissue and prevent recurrent infections  Holmium Laser Enucleation of the Prostate (HoLEP)  HoLEP is a treatment for men with benign prostatic hyperplasia (BPH). The laser surgery removed blockages of urine flow, and is done without any incisions on the body.     What is HoLEP?  HoLEP is a type of laser surgery used to treat obstruction (blockage) of urine flow as a result of benign prostatic hyperplasia (BPH). In men with BPH, the prostate gland is not cancerous, but has become enlarged. An enlarged prostate can result in a number of urinary tract symptoms such as weak urinary stream, difficulty in starting urination, inability to urinate, frequent urination, or getting up at night to urinate.  HoLEP was developed in the 1990's as a more effective and less expensive surgical option for BPH, compared to other surgical options such as laser vaporization(PVP/greenlight laser), transurethral resection of the prostate(TURP), and open simple prostatectomy.   What happens during a HoLEP?  HoLEP requires general anesthesia ("asleep" throughout the procedure).   An antibiotic is given to reduce the risk of infection  A surgical instrument called a resectoscope is inserted through the urethra (the tube that carries urine from the bladder). The resectoscope has a camera that allows the surgeon to view the internal structure of the prostate gland, and to see where the incisions are being made during surgery.  The laser is inserted into the resectoscope and is used to enucleate (free up) the enlarged prostate tissue from the capsule (outer shell) and then to seal up any blood vessels. The tissue that has  been removed is pushed back into the bladder.  A morcellator is placed through the resectoscope, and is used to suction out the prostate tissue that has been pushed into the bladder.  When the prostate tissue has been removed, the resectoscope is removed, and a foley catheter is placed to allow healing and drain the urine from the bladder.     What happens after a HoLEP?  More than 95% of patients go home the same day a few hours after surgery. Less than 5% will be admitted to the hospital overnight for observation to monitor the urine, or if they have other medical problems.  Fluid is flushed through the catheter for about 1 hour after surgery to clear any blood from the urine. It is normal to have some blood in the urine after surgery. The need for blood transfusion is extremely rare.  Eating and drinking are permitted after the procedure once the patient has fully awakened from anesthesia.  The catheter is usually removed 2-3 days after surgery- the patient will come to clinic to have the catheter removed and make sure they can urinate on their own.  It is very important to drink lots of fluids after surgery for one week to keep the bladder flushed.  At first, there may be some burning with urination, but this typically improved within a few hours to days. Most patients do not have a significant amount of pain, and narcotic pain medications are rarely needed.  Symptoms of urinary frequency, urgency, and even leakage are NORMAL for the first few weeks after surgery as the bladder adjusts after having to work hard against  blockage from the prostate for many years. This will improve, but can sometimes take several months.  The use of pelvic floor exercises (Kegel exercises) can help improve problems with urinary incontinence.   After catheter removal, patients will be seen at 12 weeks and 6 months for symptom check  No heavy lifting for at least 2-3 weeks after surgery, however patients  can walk and do light activities the first day after surgery. Return to work time depends on occupation.    What are the advantages of HoLEP?  HoLEP has been studied in many different parts of the world and has been shown to be a safe and effective procedure. Although there are many types of BPH surgeries available, HoLEP offers a unique advantage in being able to remove a large amount of tissue without any incisions on the body, even in very large prostates, while decreasing the risk of bleeding and providing tissue for pathology (to look for cancer). This decreases the need for blood transfusions during surgery, minimizes hospital stay, and reduces the risk of needing repeat treatment.  What are the side effects of HoLEP?  Temporary burning and bleeding during urination. Some blood may be seen in the urine for weeks after surgery and is part of the healing process.  Urinary incontinence (inability to control urine flow) is expected in all patients immediately after surgery and they should wear pads for the first few days/weeks. This typically improves over the course of several weeks. Performing Kegel exercises can help decrease leakage from stress maneuvers such as coughing, sneezing, or lifting. The rate of long term leakage is very low, 1-2%. Patients may also have leakage with urgency and this may be treated with medication. The risk of urge incontinence can be dependent on several factors including age, prostate size, symptoms, and other medical problems.  Retrograde ejaculation or "backwards ejaculation." In 80% of cases, the patient will not see any fluid during ejaculation after surgery.  Erectile function is generally not significantly affected.   What are the risks of HoLEP?  Injury to the urethra or development of scar tissue at a later date  Injury to the capsule of the prostate (typically treated with longer catheterization).  Injury to the bladder or ureteral orifices (where  the urine from the kidney drains out)  Infection of the bladder, testes, or kidneys (~4%)  Return of urinary obstruction at a later date requiring another operation (<2%)  Need for blood transfusion or re-operation due to bleeding  Failure to relieve all symptoms and/or need for prolonged catheterization after surgery  5-15% of patients are found to have previously undiagnosed prostate cancer in their specimen. Prostate cancer can be treated after HoLEP.  Standard risks of anesthesia including blood clots, heart attacks, etc ~1-2% risk of long term urinary incontinence (leakage)  When should I call my doctor?  Fever over 101.3 degrees  Inability to urinate, or large blood clots in the urine

## 2024-02-02 NOTE — Progress Notes (Signed)
   02/02/2024 10:42 AM   David Trevino [birth year] [id number]  Reason for visit: Follow up recurrent UTI/prostatitis, PSA screening  HPI: 78 year old male with a history of recurrent UTIs/prostatitis.  I saw him in 2020 for similar issues and CT urogram and cystoscopy were benign, and he improved on cranberry tablet prophylaxis.  Starting in June 2024 he had multiple recurrent infections despite multiple courses of antibiotics, with symptoms of weak stream, foul-smelling urine, intermittency, and burning.  PVRs have always been normal.  He was ultimately treated with a 4-week course of Cipro for E. coli infection in December 2024 which completely resolved his symptoms.  CT was ordered to evaluate for abscess or other cause of recurrent infections.  I personally viewed and interpreted the CT urogram dated 10/20/2023, small nonobstructive bilateral renal stones, no hydronephrosis, decompressed bladder, prostate measures 50 g.  He denies any problems since that time and is doing well.  PVR today is normal at 0ml.  I encouraged him to continue the cranberry tablet prophylaxis.  We discussed other options like cystoscopy or consideration of HOLEP but he defers at this time.  He also recently had a urine test done with PCP that was benign, PSA was also normal at 3.97.  Very reassuring PSA density of 0.08.  We discussed that routine PSA screening not recommended over age 110.  RTC 1 year PVR, sooner if problems.  If recurrent infections would again recommend considering cystoscopy and HOLEP   [name], MD  [facility] [address], [address] [place], [place] [address] [phone]

## 2024-06-27 ENCOUNTER — Other Ambulatory Visit: Payer: Self-pay | Admitting: Physical Medicine & Rehabilitation

## 2024-06-27 ENCOUNTER — Ambulatory Visit
Admission: RE | Admit: 2024-06-27 | Discharge: 2024-06-27 | Disposition: A | Discharge status: Home / Self Care | Source: Ambulatory Visit | Attending: Physical Medicine & Rehabilitation | Admitting: Physical Medicine & Rehabilitation

## 2024-06-27 DIAGNOSIS — R29898 Other symptoms and signs involving the musculoskeletal system: Secondary | ICD-10-CM

## 2024-07-05 ENCOUNTER — Encounter: Payer: Self-pay | Admitting: Urology

## 2025-02-06 ENCOUNTER — Ambulatory Visit: Admitting: Urology

## 2025-02-07 ENCOUNTER — Ambulatory Visit: Admitting: Urology
